# Patient Record
Sex: Female | Born: 1965
Health system: Southern US, Community
[De-identification: ages and names within clinical notes are randomized; demographics above are authoritative.]

## PROBLEM LIST (undated history)

## (undated) DIAGNOSIS — Z803 Family history of malignant neoplasm of breast: Secondary | ICD-10-CM

## (undated) DIAGNOSIS — R32 Unspecified urinary incontinence: Secondary | ICD-10-CM

## (undated) DIAGNOSIS — Z0000 Encounter for general adult medical examination without abnormal findings: Secondary | ICD-10-CM

## (undated) DIAGNOSIS — B029 Zoster without complications: Secondary | ICD-10-CM

## (undated) DIAGNOSIS — Z8041 Family history of malignant neoplasm of ovary: Secondary | ICD-10-CM

## (undated) DIAGNOSIS — N840 Polyp of corpus uteri: Secondary | ICD-10-CM

## (undated) DIAGNOSIS — K640 First degree hemorrhoids: Secondary | ICD-10-CM

## (undated) DIAGNOSIS — G35D Multiple sclerosis, unspecified: Secondary | ICD-10-CM

## (undated) DIAGNOSIS — G35 Multiple sclerosis: Secondary | ICD-10-CM

## (undated) DIAGNOSIS — F32A Depression, unspecified: Secondary | ICD-10-CM

## (undated) DIAGNOSIS — F329 Major depressive disorder, single episode, unspecified: Secondary | ICD-10-CM

## (undated) HISTORY — DX: Zoster without complications: B02.9

## (undated) HISTORY — PX: OVARY SURGERY: SHX727

## (undated) HISTORY — DX: Polyp of corpus uteri: N84.0

## (undated) HISTORY — PX: BREAST CYST ASPIRATION: SHX578

## (undated) HISTORY — PX: POLYPECTOMY: SHX149

## (undated) HISTORY — DX: Encounter for general adult medical examination without abnormal findings: Z00.00

## (undated) HISTORY — DX: Depression, unspecified: F32.A

## (undated) HISTORY — DX: First degree hemorrhoids: K64.0

## (undated) HISTORY — DX: Major depressive disorder, single episode, unspecified: F32.9

## (undated) HISTORY — DX: Family history of malignant neoplasm of breast: Z80.3

## (undated) HISTORY — DX: Unspecified urinary incontinence: R32

## (undated) HISTORY — DX: Family history of malignant neoplasm of ovary: Z80.41

---

## 2005-05-03 ENCOUNTER — Ambulatory Visit: Payer: Self-pay | Admitting: Unknown Physician Specialty

## 2005-12-29 ENCOUNTER — Ambulatory Visit: Payer: Self-pay | Admitting: Neurology

## 2006-10-15 ENCOUNTER — Ambulatory Visit: Payer: Self-pay | Admitting: Emergency Medicine

## 2007-03-19 ENCOUNTER — Ambulatory Visit: Payer: Self-pay | Admitting: Internal Medicine

## 2007-03-27 ENCOUNTER — Ambulatory Visit: Payer: Self-pay | Admitting: Unknown Physician Specialty

## 2007-05-07 HISTORY — PX: OTHER SURGICAL HISTORY: SHX169

## 2007-06-08 ENCOUNTER — Ambulatory Visit: Payer: Self-pay | Admitting: Unknown Physician Specialty

## 2007-06-14 ENCOUNTER — Ambulatory Visit: Payer: Self-pay | Admitting: Unknown Physician Specialty

## 2008-04-23 ENCOUNTER — Ambulatory Visit: Payer: Self-pay | Admitting: Unknown Physician Specialty

## 2009-05-27 ENCOUNTER — Ambulatory Visit: Payer: Self-pay | Admitting: Unknown Physician Specialty

## 2010-05-31 ENCOUNTER — Ambulatory Visit: Payer: Self-pay | Admitting: Unknown Physician Specialty

## 2011-06-11 ENCOUNTER — Ambulatory Visit: Payer: Self-pay | Admitting: Neurology

## 2011-06-22 ENCOUNTER — Ambulatory Visit: Payer: Self-pay | Admitting: Unknown Physician Specialty

## 2012-06-25 ENCOUNTER — Ambulatory Visit: Payer: Self-pay | Admitting: Unknown Physician Specialty

## 2012-06-26 ENCOUNTER — Ambulatory Visit: Payer: Self-pay | Admitting: Unknown Physician Specialty

## 2012-07-19 ENCOUNTER — Other Ambulatory Visit: Payer: Self-pay | Admitting: Neurology

## 2012-07-19 LAB — BASIC METABOLIC PANEL
Anion Gap: 6 — ABNORMAL LOW (ref 7–16)
BUN: 11 mg/dL (ref 7–18)
Chloride: 104 mmol/L (ref 98–107)
Co2: 28 mmol/L (ref 21–32)
EGFR (African American): >60

## 2012-07-19 LAB — CBC WITH DIFFERENTIAL/PLATELET
Basophil %: 0.5 %
Eosinophil %: 1.4 %
Lymphocyte #: 2 10*3/uL (ref 1.0–3.6)
Lymphocyte %: 28 %
MCH: 31.5 pg (ref 26.0–34.0)
MCHC: 34.4 g/dL (ref 32.0–36.0)
Monocyte #: 0.6 x10 3/mm (ref 0.2–0.9)
Monocyte %: 8.2 %
Neutrophil #: 4.5 10*3/uL (ref 1.4–6.5)
Platelet: 274 10*3/uL (ref 150–440)
RBC: 3.9 10*6/uL (ref 3.80–5.20)
WBC: 7.2 10*3/uL (ref 3.6–11.0)

## 2012-07-19 LAB — TSH: Thyroid Stimulating Horm: 2.14 u[IU]/mL

## 2013-05-16 DIAGNOSIS — R32 Unspecified urinary incontinence: Secondary | ICD-10-CM

## 2013-05-16 HISTORY — DX: Unspecified urinary incontinence: R32

## 2013-06-26 ENCOUNTER — Ambulatory Visit: Payer: Self-pay | Admitting: Unknown Physician Specialty

## 2013-06-28 ENCOUNTER — Ambulatory Visit: Payer: Self-pay | Admitting: Neurology

## 2013-10-28 ENCOUNTER — Ambulatory Visit: Payer: Self-pay | Admitting: Obstetrics and Gynecology

## 2013-10-28 LAB — CBC
HCT: 34.5 % — ABNORMAL LOW (ref 35.0–47.0)
HGB: 11.8 g/dL — AB (ref 12.0–16.0)
MCH: 31.2 pg (ref 26.0–34.0)
MCHC: 34 g/dL (ref 32.0–36.0)
MCV: 92 fL (ref 80–100)
Platelet: 243 10*3/uL (ref 150–440)
RBC: 3.76 10*6/uL — ABNORMAL LOW (ref 3.80–5.20)
RDW: 12.2 % (ref 11.5–14.5)
WBC: 5.6 10*3/uL (ref 3.6–11.0)

## 2013-11-07 ENCOUNTER — Ambulatory Visit: Payer: Self-pay | Admitting: Obstetrics and Gynecology

## 2013-11-07 HISTORY — PX: SALPINGECTOMY: SHX328

## 2013-11-08 LAB — PATHOLOGY REPORT

## 2014-01-02 HISTORY — PX: INTRAUTERINE DEVICE (IUD) INSERTION: SHX5877

## 2014-01-06 DIAGNOSIS — R200 Anesthesia of skin: Secondary | ICD-10-CM

## 2014-01-06 DIAGNOSIS — R202 Paresthesia of skin: Secondary | ICD-10-CM

## 2014-01-06 HISTORY — DX: Anesthesia of skin: R20.0

## 2014-01-06 HISTORY — DX: Anesthesia of skin: R20.2

## 2014-06-20 ENCOUNTER — Encounter: Payer: Self-pay | Admitting: Nurse Practitioner

## 2014-06-20 ENCOUNTER — Encounter (INDEPENDENT_AMBULATORY_CARE_PROVIDER_SITE_OTHER): Payer: Self-pay

## 2014-06-20 ENCOUNTER — Ambulatory Visit (INDEPENDENT_AMBULATORY_CARE_PROVIDER_SITE_OTHER): Payer: 59 | Admitting: Nurse Practitioner

## 2014-06-20 VITALS — BP 118/80 | HR 82 | Temp 97.7°F | Resp 12 | Ht 67.0 in | Wt 147.4 lb

## 2014-06-20 DIAGNOSIS — Z0000 Encounter for general adult medical examination without abnormal findings: Secondary | ICD-10-CM

## 2014-06-20 DIAGNOSIS — Z7689 Persons encountering health services in other specified circumstances: Secondary | ICD-10-CM

## 2014-06-20 DIAGNOSIS — Z130 Encounter for screening for diseases of the blood and blood-forming organs and certain disorders involving the immune mechanism: Secondary | ICD-10-CM

## 2014-06-20 DIAGNOSIS — Z7189 Other specified counseling: Secondary | ICD-10-CM

## 2014-06-20 LAB — CBC WITH DIFFERENTIAL/PLATELET
BASOS PCT: 0.3 % (ref 0.0–3.0)
Basophils Absolute: 0 10*3/uL (ref 0.0–0.1)
Eosinophils Absolute: 0.2 10*3/uL (ref 0.0–0.7)
Eosinophils Relative: 2.2 % (ref 0.0–5.0)
HEMATOCRIT: 36.3 % (ref 36.0–46.0)
Hemoglobin: 12.7 g/dL (ref 12.0–15.0)
Lymphocytes Relative: 20.4 % (ref 12.0–46.0)
Lymphs Abs: 1.4 10*3/uL (ref 0.7–4.0)
MCHC: 34.8 g/dL (ref 30.0–36.0)
MCV: 91 fl (ref 78.0–100.0)
MONO ABS: 0.5 10*3/uL (ref 0.1–1.0)
Monocytes Relative: 6.8 % (ref 3.0–12.0)
Neutro Abs: 4.8 10*3/uL (ref 1.4–7.7)
Neutrophils Relative %: 70.3 % (ref 43.0–77.0)
Platelets: 270 10*3/uL (ref 150.0–400.0)
RBC: 3.99 Mil/uL (ref 3.87–5.11)
RDW: 12.5 % (ref 11.5–15.5)
WBC: 6.8 10*3/uL (ref 4.0–10.5)

## 2014-06-20 LAB — TSH: TSH: 1.98 u[IU]/mL (ref 0.35–4.50)

## 2014-06-20 LAB — HEMOGLOBIN A1C: HEMOGLOBIN A1C: 5.6 % (ref 4.6–6.5)

## 2014-06-20 NOTE — Progress Notes (Signed)
Pre visit review using our clinic review tool, if applicable. No additional management support is needed unless otherwise documented below in the visit note. 

## 2014-06-20 NOTE — Patient Instructions (Signed)
Follow up in 1 year or sooner if you need to see [place].   We can send in a referral for Mammogram as needed.   Welcome to [facility]!

## 2014-06-20 NOTE — Progress Notes (Signed)
Subjective:    Patient ID: Pam Hamilton, female    DOB: 06/07/1965, 49 y.o.   MRN: [id number]  HPI  Pam Hamilton is a 49 yo female establishing care.   1) Health Maintenance-   Diet- Cooks at home often, Eating healthier   Exercise- No formal, walks once a week  Immunizations- Up to date- Flu, tetanus   Mammogram- 2015  Pap- 2015  Eye Exam- 2015, up to date  Dental Exam- up to date  2) Chronic Problems-  MS since 1997- Dr. Melrose Nakayama, Neurontin for MS   Optic Neuritis   Cyst on ovary removed- Right 2015  Fibroids in uterus- 2010 at [facility] Dr. Joylene Grapes did the surgery f/u s/p surgery   Shingles- 2015 IV steroids for 3 days for flare up of MS and then broke out with shingles     Review of Systems  Constitutional: Negative for fever, chills, diaphoresis, fatigue and unexpected weight change.  HENT: Negative for tinnitus and trouble swallowing.   Gastrointestinal: Negative for nausea, vomiting, abdominal pain, diarrhea, constipation and blood in stool.  Endocrine: Negative for polydipsia, polyphagia and polyuria.  Genitourinary: Negative for dysuria, hematuria, vaginal bleeding, vaginal discharge and vaginal pain.  Musculoskeletal: Negative for myalgias, back pain, arthralgias and gait problem.  Skin: Negative for color change and rash.  Neurological: Positive for numbness. Negative for dizziness, weakness and headaches.       Numbness of left shin from MS  Hematological: Does not bruise/bleed easily.  Psychiatric/Behavioral: Negative for suicidal ideas and sleep disturbance. The patient is not nervous/anxious.    Past Medical History  Diagnosis Date  . Depression     1997  . Urine incontinence 2015    2 Episodes     History   Social History  . Marital Status: Married    Spouse Name: N/A    Number of Children: N/A  . Years of Education: N/A   Occupational History  . Not on file.   Social History Main Topics  . Smoking status: Never Smoker   .  Smokeless tobacco: Never Used  . Alcohol Use: 0.6 oz/week    1 Glasses of wine per week     Comment: Once a month   . Drug Use: No  . Sexual Activity: Not Currently   Other Topics Concern  . Not on file   Social History Narrative   Lives in [place] with Husband   2 children 18 and 17 both boys   1 Cat and 1 Rabbit   [employer]   Enjoys attending church, being with her boys           Past Surgical History  Procedure Laterality Date  . Breast biopsy  2000    Benign cysts  . Polypectomy  2010    Removal of polyps from uterine.  . Ovary surgery  2015    Right ovary removed.  Benighn cyst.    Family History  Problem Relation Age of Onset  . Hyperlipidemia Mother   . Hypertension Mother   . Diabetes Mother   . Alcohol abuse Father   . Anuerysm Paternal Grandmother     Allergies  Allergen Reactions  . Ciprofloxacin Swelling    Cipro  . Septra [Sulfamethoxazole-Trimethoprim] Swelling    No current outpatient prescriptions on file prior to visit.   No current facility-administered medications on file prior to visit.      Objective:   Physical Exam  Constitutional: She is oriented to  person, place, and time. She appears well-developed and well-nourished. No distress.  BP 118/80 mmHg  Pulse 82  Temp(Src) 97.7 F (36.5 C) (Oral)  Resp 12  Ht 5\' 7"  (1.702 m)  Wt 147 lb 6.4 oz (66.86 kg)  BMI 23.08 kg/m2  SpO2 98%  LMP  (Approximate)   HENT:  Head: Normocephalic and atraumatic.  Right Ear: External ear normal.  Left Ear: External ear normal.  Nose: Nose normal.  Mouth/Throat: Oropharynx is clear and moist. No oropharyngeal exudate.  TMs and canals clear bilaterally  Eyes: Conjunctivae and EOM are normal. Pupils are equal, round, and reactive to light. Right eye exhibits no discharge. Left eye exhibits no discharge. No scleral icterus.  Neck: Normal range of motion. Neck supple. No thyromegaly present.  Cardiovascular: Normal rate,  regular rhythm, normal heart sounds and intact distal pulses.  Exam reveals no gallop and no friction rub.   No murmur heard. Pulmonary/Chest: Effort normal and breath sounds normal. No respiratory distress. She has no wheezes. She has no rales. She exhibits no tenderness.  Abdominal: Soft. Bowel sounds are normal. She exhibits no distension and no mass. There is no tenderness. There is no rebound and no guarding.  Musculoskeletal: Normal range of motion. She exhibits no edema or tenderness.  Lymphadenopathy:    She has no cervical adenopathy.  Neurological: She is alert and oriented to person, place, and time. She has normal reflexes. No cranial nerve deficit. She exhibits normal muscle tone. Coordination normal.  Skin: Skin is warm and dry. No rash noted. She is not diaphoretic. No erythema. No pallor.  Psychiatric: She has a normal mood and affect. Her behavior is normal. Judgment and thought content normal.      Assessment & Plan:

## 2014-06-23 DIAGNOSIS — Z0000 Encounter for general adult medical examination without abnormal findings: Secondary | ICD-10-CM | POA: Insufficient documentation

## 2014-06-23 DIAGNOSIS — Z7689 Persons encountering health services in other specified circumstances: Secondary | ICD-10-CM

## 2014-06-23 HISTORY — DX: Persons encountering health services in other specified circumstances: Z76.89

## 2014-06-23 HISTORY — DX: Encounter for general adult medical examination without abnormal findings: Z00.00

## 2014-06-23 NOTE — Assessment & Plan Note (Signed)
Pt establishing care with [place] today. Other providers include Neuro- Dr. Melrose Nakayama and OB/GYN Dr. Ammie Dalton.

## 2014-06-23 NOTE — Assessment & Plan Note (Signed)
Discussed acute and chronic issues. Reviewed health maintenance measures, PFSHx, and immunizations. Obtain routine labs TSH, Lipid panel, CBC w/ diff, A1c, and CMET. Denies need refills today. FU 1 year for physical.

## 2014-06-27 ENCOUNTER — Other Ambulatory Visit: Payer: 59

## 2014-07-04 ENCOUNTER — Other Ambulatory Visit (INDEPENDENT_AMBULATORY_CARE_PROVIDER_SITE_OTHER): Payer: 59

## 2014-07-04 DIAGNOSIS — Z0000 Encounter for general adult medical examination without abnormal findings: Secondary | ICD-10-CM

## 2014-07-04 LAB — LIPID PANEL
CHOL/HDL RATIO: 2
Cholesterol: 104 mg/dL (ref 0–200)
HDL: 55 mg/dL (ref >39.00)
LDL Cholesterol: 40 mg/dL (ref 0–99)
NonHDL: 49
Triglycerides: 44 mg/dL (ref 0.0–149.0)
VLDL: 8.8 mg/dL (ref 0.0–40.0)

## 2014-07-04 LAB — COMPREHENSIVE METABOLIC PANEL
ALT: 9 U/L (ref 0–35)
AST: 14 U/L (ref 0–37)
Albumin: 4.4 g/dL (ref 3.5–5.2)
Alkaline Phosphatase: 58 U/L (ref 39–117)
BILIRUBIN TOTAL: 0.7 mg/dL (ref 0.2–1.2)
BUN: 14 mg/dL (ref 6–23)
CO2: 28 mEq/L (ref 19–32)
Calcium: 9.3 mg/dL (ref 8.4–10.5)
Chloride: 103 mEq/L (ref 96–112)
Creatinine, Ser: 0.66 mg/dL (ref 0.40–1.20)
GFR: 101.64 mL/min (ref >60.00)
GLUCOSE: 77 mg/dL (ref 70–99)
Potassium: 4.4 mEq/L (ref 3.5–5.1)
Sodium: 137 mEq/L (ref 135–145)
Total Protein: 7.1 g/dL (ref 6.0–8.3)

## 2014-07-22 ENCOUNTER — Ambulatory Visit: Payer: Self-pay | Admitting: Unknown Physician Specialty

## 2014-09-06 NOTE — Op Note (Signed)
PATIENT NAME:  Pam Hamilton, Pam Hamilton MR#:  [id number] DATE OF BIRTH:  04/24/1966  DATE OF PROCEDURE:  11/07/2013  PREOPERATIVE DIAGNOSIS: Chronic pelvic pain, right ovarian cyst and endometrial polyp.   POSTOPERATIVE DIAGNOSIS: Endometriosis with what appears to be a right endometrioma versus hemorrhagic corpus luteum. Normal uterine cavity.   OPERATION PERFORMED: Hysteroscopy, dilatation and curettage,  and right oophorectomy via laparoscopy.   PRIMARY SURGEON: Stoney Bang. Georgianne Fick, MD   ANESTHESIA USED: General.    ESTIMATED BLOOD LOSS: 25 mL.   OPERATIVE FLUIDS: 400 mL of crystalloid.   URINE OUTPUT: 200 mL of clear urine.   PREOPERATIVE ANTIBIOTICS: None.   DRAINS OR TUBES: None.   IMPLANTS: None.   INTRAOPERATIVE FINDINGS: Normal endometrial cavity with focal thickening anterior, more likely disruption of the endometrium from uterine sound. The intra-abdominal anatomy was grossly normal other than some scarring in the posterior cul-de-sac and some subtle old endometriosis-type looking lesions. The right ovary contained a 2-cm ovarian cyst which looked consistent with a possible endometrioma. Both ureters were visualized at the beginning and end of the case. The right ovary was removed intact using a 5-mm LigaSure.   SPECIMENS REMOVED: Endometrial curettings and right ovary.   PATIENT CONDITION FOLLOWING PROCEDURE: Stable.   PROCEDURE IN DETAIL: Risks, benefits, and alternatives of the procedure were discussed with the patient prior to proceeding to the Operating Room. The patient was taken to the Operating Room where she was placed under general endotracheal anesthesia. She was positioned in the dorsal lithotomy position using Allen stirrups, prepped and draped in the usual sterile fashion. A timeout was performed. Attention was turned to the patient's pelvis. The bladder was straight catheterized with a red rubber catheter.   An operative speculum was then placed. The cervix was  grasped with a single-tooth tenaculum and sequentially dilated using Pratt dilators. Hysteroscopy revealed a normal uterine cavity. Sharp curettage was performed. Following this, the single-tooth tenaculum was changed out for a Hulka tenaculum to allow manipulation of the uterus for the laparoscopy portion of the case.   Attention was turned to the patient's abdomen. The umbilicus was infiltrated with 1% lidocaine. A 5-mm Xcel trocar was then used to gain direct entry into the peritoneal cavity under visualization. Pneumoperitoneum was established. A second 5-mm assistant port was placed in [facility] as well as a left lower quadrant 11-mm port. Inspection of the abdomen noted the above findings. The right ovary was removed using a 5 mm LigaSure, detaching the ovary from its attachments to the IP ligament, and utero-ovarian ligament as well as the intervening portion of mesosalpinx. The ureter was clearly visualized coursing well away from the site of dissection. Following removal of the ovary, the ovary was placed in an Endo Catch bag and removed intact. Inspection of the pedicles noted good hemostasis. Pneumoperitoneum was evacuated. The 5-mm port site was closed with 4-0 Monocryl. All port sites were then dressed with Dermabond. Sponge, needle, and instrument counts were correct x2. The patient tolerated the procedure well and was taken to recovery room in stable condition.    ____________________________ Stoney Bang. Georgianne Fick, MD ams:lm D: 11/11/2013 16:12:16 ET T: 11/12/2013 04:34:13 ET JOB#: [id number]  cc: Stoney Bang. Georgianne Fick, MD, <Dictator> Dorthula Nettles MD ELECTRONICALLY SIGNED 11/16/2013 8:08

## 2015-03-10 DIAGNOSIS — G8929 Other chronic pain: Secondary | ICD-10-CM | POA: Insufficient documentation

## 2015-03-10 DIAGNOSIS — M79671 Pain in right foot: Secondary | ICD-10-CM

## 2015-05-14 ENCOUNTER — Encounter: Payer: Self-pay | Admitting: Physician Assistant

## 2015-05-14 ENCOUNTER — Ambulatory Visit: Payer: Self-pay | Admitting: Physician Assistant

## 2015-05-14 VITALS — BP 118/80 | HR 76 | Temp 98.8°F

## 2015-05-14 DIAGNOSIS — J018 Other acute sinusitis: Secondary | ICD-10-CM

## 2015-05-14 MED ORDER — AZITHROMYCIN 250 MG PO TABS: ORAL_TABLET | ORAL | Status: DC

## 2015-05-14 MED ORDER — FLUTICASONE PROPIONATE 50 MCG/ACT NA SUSP: 2.0000 | Freq: Every day | NASAL | Status: DC

## 2015-05-14 NOTE — Progress Notes (Signed)
S: C/o runny nose and congestion for 3 days, no fever, chills, cp/sob, v/d; mucus is green and thick, cough is sporadic, c/o of facial and dental pain.   Using otc meds:   O: PE: perrl eomi, normocephalic, tms dull, nasal mucosa red and swollen, throat injected, neck supple no lymph, lungs c t a, cv rrr, neuro intact  A:  Acute sinusitis   P: zpack, flonase; drink fluids, continue regular meds , use otc meds of choice, return if not improving in 5 days, return earlier if worsening

## 2015-07-14 DIAGNOSIS — Z01419 Encounter for gynecological examination (general) (routine) without abnormal findings: Secondary | ICD-10-CM | POA: Diagnosis not present

## 2015-07-14 LAB — HM PAP SMEAR: HM PAP: NEGATIVE

## 2015-08-14 ENCOUNTER — Other Ambulatory Visit: Payer: Self-pay | Admitting: Unknown Physician Specialty

## 2015-08-14 DIAGNOSIS — Z1231 Encounter for screening mammogram for malignant neoplasm of breast: Secondary | ICD-10-CM

## 2015-08-18 ENCOUNTER — Emergency Department (HOSPITAL_COMMUNITY): Payer: No Typology Code available for payment source

## 2015-08-18 ENCOUNTER — Encounter (HOSPITAL_COMMUNITY): Payer: Self-pay

## 2015-08-18 ENCOUNTER — Emergency Department (HOSPITAL_COMMUNITY)
Admission: EM | Admit: 2015-08-18 | Discharge: 2015-08-18 | Disposition: A | Discharge status: Home / Self Care | Payer: No Typology Code available for payment source | Attending: Emergency Medicine | Admitting: Emergency Medicine

## 2015-08-18 DIAGNOSIS — S0990XA Unspecified injury of head, initial encounter: Secondary | ICD-10-CM | POA: Insufficient documentation

## 2015-08-18 DIAGNOSIS — Z79899 Other long term (current) drug therapy: Secondary | ICD-10-CM | POA: Diagnosis not present

## 2015-08-18 DIAGNOSIS — Y9389 Activity, other specified: Secondary | ICD-10-CM | POA: Insufficient documentation

## 2015-08-18 DIAGNOSIS — Y998 Other external cause status: Secondary | ICD-10-CM | POA: Insufficient documentation

## 2015-08-18 DIAGNOSIS — R102 Pelvic and perineal pain: Secondary | ICD-10-CM | POA: Diagnosis not present

## 2015-08-18 DIAGNOSIS — Z3202 Encounter for pregnancy test, result negative: Secondary | ICD-10-CM | POA: Insufficient documentation

## 2015-08-18 DIAGNOSIS — F329 Major depressive disorder, single episode, unspecified: Secondary | ICD-10-CM | POA: Diagnosis not present

## 2015-08-18 DIAGNOSIS — S29001A Unspecified injury of muscle and tendon of front wall of thorax, initial encounter: Secondary | ICD-10-CM | POA: Diagnosis present

## 2015-08-18 DIAGNOSIS — Y92410 Unspecified street and highway as the place of occurrence of the external cause: Secondary | ICD-10-CM | POA: Insufficient documentation

## 2015-08-18 DIAGNOSIS — S3991XA Unspecified injury of abdomen, initial encounter: Secondary | ICD-10-CM | POA: Diagnosis not present

## 2015-08-18 DIAGNOSIS — S199XXA Unspecified injury of neck, initial encounter: Secondary | ICD-10-CM | POA: Diagnosis not present

## 2015-08-18 DIAGNOSIS — S299XXA Unspecified injury of thorax, initial encounter: Secondary | ICD-10-CM | POA: Insufficient documentation

## 2015-08-18 DIAGNOSIS — M546 Pain in thoracic spine: Secondary | ICD-10-CM | POA: Diagnosis not present

## 2015-08-18 DIAGNOSIS — S2232XA Fracture of one rib, left side, initial encounter for closed fracture: Secondary | ICD-10-CM | POA: Insufficient documentation

## 2015-08-18 DIAGNOSIS — S3992XA Unspecified injury of lower back, initial encounter: Secondary | ICD-10-CM | POA: Diagnosis not present

## 2015-08-18 DIAGNOSIS — R51 Headache: Secondary | ICD-10-CM | POA: Diagnosis not present

## 2015-08-18 DIAGNOSIS — M25512 Pain in left shoulder: Secondary | ICD-10-CM | POA: Diagnosis not present

## 2015-08-18 DIAGNOSIS — S2242XA Multiple fractures of ribs, left side, initial encounter for closed fracture: Secondary | ICD-10-CM | POA: Diagnosis not present

## 2015-08-18 DIAGNOSIS — M545 Low back pain: Secondary | ICD-10-CM | POA: Diagnosis not present

## 2015-08-18 HISTORY — DX: Multiple sclerosis, unspecified: G35.D

## 2015-08-18 HISTORY — DX: Multiple sclerosis: G35

## 2015-08-18 LAB — I-STAT CHEM 8, ED
BUN: 16 mg/dL (ref 6–20)
Calcium, Ion: 1.12 mmol/L (ref 1.12–1.23)
Chloride: 102 mmol/L (ref 101–111)
Creatinine, Ser: 0.8 mg/dL (ref 0.44–1.00)
Glucose, Bld: 126 mg/dL — ABNORMAL HIGH (ref 65–99)
HEMATOCRIT: 39 % (ref 36.0–46.0)
HEMOGLOBIN: 13.3 g/dL (ref 12.0–15.0)
Potassium: 3.9 mmol/L (ref 3.5–5.1)
Sodium: 139 mmol/L (ref 135–145)
TCO2: 26 mmol/L (ref 0–100)

## 2015-08-18 LAB — I-STAT BETA HCG BLOOD, ED (MC, WL, AP ONLY): I-stat hCG, quantitative: <5 m[IU]/mL (ref <5)

## 2015-08-18 MED ORDER — MORPHINE SULFATE (PF) 2 MG/ML IV SOLN
2.0000 mg | Freq: Once | INTRAVENOUS | Status: AC
  Administered 2015-08-18: 2 mg via INTRAVENOUS
  Filled 2015-08-18: qty 1

## 2015-08-18 MED ORDER — NAPROXEN 250 MG PO TABS
500.0000 mg | ORAL_TABLET | Freq: Once | ORAL | Status: AC
  Administered 2015-08-18: 500 mg via ORAL
  Filled 2015-08-18: qty 2

## 2015-08-18 MED ORDER — MORPHINE SULFATE (PF) 4 MG/ML IV SOLN: 4.0000 mg | Freq: Once | INTRAVENOUS | Status: DC

## 2015-08-18 MED ORDER — IOPAMIDOL (ISOVUE-300) INJECTION 61%
INTRAVENOUS | Status: AC
  Administered 2015-08-18: 80 mL via INTRAVENOUS
  Filled 2015-08-18: qty 100

## 2015-08-18 MED ORDER — OXYCODONE-ACETAMINOPHEN 5-325 MG PO TABS
1.0000 | ORAL_TABLET | Freq: Once | ORAL | Status: AC
  Administered 2015-08-18: 1 via ORAL
  Filled 2015-08-18: qty 1

## 2015-08-18 MED ORDER — NAPROXEN 500 MG PO TABS: 500.0000 mg | ORAL_TABLET | Freq: Two times a day (BID) | ORAL | Status: DC | PRN

## 2015-08-18 MED ORDER — OXYCODONE-ACETAMINOPHEN 5-325 MG PO TABS: 2.0000 | ORAL_TABLET | Freq: Four times a day (QID) | ORAL | Status: DC | PRN

## 2015-08-18 NOTE — ED Notes (Signed)
Pt ambulated in hallway without assistance from staff, denies SOB.
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
## 2015-08-18 NOTE — ED Notes (Signed)
Patient transported to CT 

## 2015-08-20 ENCOUNTER — Telehealth: Payer: Self-pay | Admitting: *Deleted

## 2015-08-20 NOTE — Telephone Encounter (Signed)
Spoke with patient and because [facility] does not have any appointments that patient could do put her on Dr. Geralynn Ochs schedule.
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
## 2015-08-21 ENCOUNTER — Ambulatory Visit (INDEPENDENT_AMBULATORY_CARE_PROVIDER_SITE_OTHER): Payer: 59 | Admitting: Family Medicine

## 2015-08-21 ENCOUNTER — Encounter: Payer: Self-pay | Admitting: Family Medicine

## 2015-08-21 VITALS — BP 128/78 | HR 75 | Temp 97.8°F | Wt 144.4 lb

## 2015-08-21 DIAGNOSIS — S2242XA Multiple fractures of ribs, left side, initial encounter for closed fracture: Secondary | ICD-10-CM | POA: Diagnosis not present

## 2015-08-21 MED ORDER — OXYCODONE-ACETAMINOPHEN 5-325 MG PO TABS: 1.0000 | ORAL_TABLET | Freq: Four times a day (QID) | ORAL | Status: DC | PRN
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
## 2015-08-21 NOTE — Progress Notes (Signed)
Pre visit review using our clinic review tool, if applicable. No additional management support is needed unless otherwise documented below in the visit note. 

## 2015-08-24 ENCOUNTER — Encounter: Payer: Self-pay | Admitting: Family Medicine

## 2015-08-24 DIAGNOSIS — S2249XA Multiple fractures of ribs, unspecified side, initial encounter for closed fracture: Secondary | ICD-10-CM | POA: Insufficient documentation
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
## 2015-08-25 ENCOUNTER — Ambulatory Visit: Payer: Self-pay | Admitting: Family Medicine

## 2015-08-25 ENCOUNTER — Ambulatory Visit: Payer: 59

## 2015-08-31 ENCOUNTER — Ambulatory Visit (INDEPENDENT_AMBULATORY_CARE_PROVIDER_SITE_OTHER): Payer: 59 | Admitting: Nurse Practitioner

## 2015-08-31 ENCOUNTER — Encounter: Payer: Self-pay | Admitting: Nurse Practitioner

## 2015-08-31 VITALS — BP 111/66 | HR 71 | Temp 98.5°F | Ht 67.0 in | Wt 140.5 lb

## 2015-08-31 DIAGNOSIS — S2242XD Multiple fractures of ribs, left side, subsequent encounter for fracture with routine healing: Secondary | ICD-10-CM | POA: Diagnosis not present

## 2015-08-31 DIAGNOSIS — S2242XA Multiple fractures of ribs, left side, initial encounter for closed fracture: Secondary | ICD-10-CM

## 2015-08-31 MED ORDER — OXYCODONE-ACETAMINOPHEN 5-325 MG PO TABS: 1.0000 | ORAL_TABLET | Freq: Three times a day (TID) | ORAL | Status: DC | PRN
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
## 2015-08-31 NOTE — Progress Notes (Signed)
Pre visit review using our clinic review tool, if applicable. No additional management support is needed unless otherwise documented below in the visit note. 

## 2015-08-31 NOTE — Patient Instructions (Signed)
See you around 09/15/15   Try the 1/2 tablet in the morning and 1/2 tablet in the evening.

## 2015-09-06 NOTE — Assessment & Plan Note (Signed)
Established problem worsening Same symptoms as last visit with Dr. Caryl Bis Encouraged continued use of incentive spirometer Letter sent for patient to remain out of work and follow-up in 2 weeks.  Refill of pain medication given today and asked her to use half as needed for pain control. Patient is agreeable upon this and reports she has done this with success in the past.

## 2015-09-14 ENCOUNTER — Ambulatory Visit: Payer: Self-pay | Admitting: Nurse Practitioner

## 2015-09-14 ENCOUNTER — Ambulatory Visit (INDEPENDENT_AMBULATORY_CARE_PROVIDER_SITE_OTHER): Payer: 59 | Admitting: Nurse Practitioner

## 2015-09-14 ENCOUNTER — Encounter: Payer: Self-pay | Admitting: Nurse Practitioner

## 2015-09-14 VITALS — BP 112/68 | HR 71 | Temp 98.1°F | Ht 67.0 in | Wt 139.8 lb

## 2015-09-14 DIAGNOSIS — S2242XD Multiple fractures of ribs, left side, subsequent encounter for fracture with routine healing: Secondary | ICD-10-CM

## 2015-09-14 DIAGNOSIS — S2242XA Multiple fractures of ribs, left side, initial encounter for closed fracture: Secondary | ICD-10-CM

## 2015-09-14 NOTE — Assessment & Plan Note (Addendum)
Weaning slowly off of percocets. Improving slowly  Precautions for 3-4 weeks at work (lifting restrictions) note given to pt FU prn worsening/failure to improve.

## 2015-09-14 NOTE — Progress Notes (Signed)
Patient ID: Pam Hamilton, female    DOB: 1966/05/07  Age: 50 y.o. MRN: [id number]  CC: Follow-up   HPI Pam Hamilton presents for follow up of rib fractures.   1) Pt was last seen 2 weeks ago for follow up. Still using incentive spirometer, letter to be out of work for 2 weeks Pain medication given to pt   Today: Pain improving Taking 1/2 tablet am and 1/2 pm  1/2 is still helpful, tried without medication today and then couldn't stand the pain.  Lying slightly more onto left side without too much pain  Sternal pain improved  Anxiety is improved with driving Walking more often- wants to get back to running   History Opel has a past medical history of Depression; Urine incontinence (2015); and Multiple sclerosis ([place]).   She has past surgical history that includes Breast biopsy (2000); Polypectomy (2010); and Ovary surgery (2015).   Her family history includes Alcohol abuse in her father; Anuerysm in her paternal grandmother; Diabetes in her mother; Hyperlipidemia in her mother; Hypertension in her mother.She reports that she has never smoked. She has never used smokeless tobacco. She reports that she drinks about 0.6 oz of alcohol per week. She reports that she does not use illicit drugs.  Outpatient Prescriptions Prior to Visit  Medication Sig Dispense Refill  . cholecalciferol (VITAMIN D) 1000 units tablet Take 1,000 Units by mouth daily.    . Dimethyl Fumarate 120 & 240 MG MISC Take 240 mg by mouth 2 (two) times daily.    Marland Kitchen gabapentin (NEURONTIN) 100 MG capsule Take 100 mg by mouth daily as needed. For foot pain    . oxyCODONE-acetaminophen (PERCOCET/ROXICET) 5-325 MG tablet Take 1 tablet by mouth every 8 (eight) hours as needed for severe pain. 30 tablet 0   No facility-administered medications prior to visit.    ROS Review of Systems  Constitutional: Negative for fever, chills, diaphoresis and fatigue.  Respiratory: Negative for chest tightness, shortness of  breath and wheezing.   Cardiovascular: Negative for chest pain, palpitations and leg swelling.  Gastrointestinal: Negative for nausea, vomiting and diarrhea.  Skin: Negative for rash.  Neurological: Negative for dizziness, weakness, numbness and headaches.  Psychiatric/Behavioral: The patient is not nervous/anxious.     Objective:  BP 112/68 mmHg  Pulse 71  Temp(Src) 98.1 F (36.7 C) (Oral)  Ht 5\' 7"  (1.702 m)  Wt 139 lb 12.8 oz (63.413 kg)  BMI 21.89 kg/m2  SpO2 99%  LMP 09/05/2015 (Exact Date)  Physical Exam  Constitutional: She is oriented to person, place, and time. She appears well-developed and well-nourished. No distress.  HENT:  Head: Normocephalic and atraumatic.  Right Ear: External ear normal.  Left Ear: External ear normal.  Cardiovascular: Normal rate, regular rhythm and normal heart sounds.   Pulmonary/Chest: Effort normal and breath sounds normal. No respiratory distress. She has no wheezes. She has no rales. She exhibits no tenderness.  Neurological: She is alert and oriented to person, place, and time. No cranial nerve deficit. She exhibits normal muscle tone. Coordination normal.  Skin: Skin is warm and dry. No rash noted. She is not diaphoretic.  Psychiatric: She has a normal mood and affect. Her behavior is normal. Judgment and thought content normal.   Assessment & Plan:   Pam Hamilton was seen today for follow-up.  Diagnoses and all orders for this visit:  Fracture of multiple ribs, left, closed, initial encounter  I am having Ms. Yodice maintain her gabapentin, Dimethyl Fumarate, cholecalciferol,  and oxyCODONE-acetaminophen.  No orders of the defined types were placed in this encounter.     Follow-up: Return if symptoms worsen or fail to improve.

## 2015-09-14 NOTE — Patient Instructions (Addendum)
Slowly start with the weaning of 1/2 tablets.   Keep up the good work!

## 2015-09-29 DIAGNOSIS — R202 Paresthesia of skin: Secondary | ICD-10-CM | POA: Diagnosis not present

## 2015-09-29 DIAGNOSIS — G35 Multiple sclerosis: Secondary | ICD-10-CM | POA: Diagnosis not present

## 2015-09-29 DIAGNOSIS — R200 Anesthesia of skin: Secondary | ICD-10-CM | POA: Diagnosis not present

## 2015-10-01 ENCOUNTER — Ambulatory Visit
Admission: RE | Admit: 2015-10-01 | Discharge: 2015-10-01 | Disposition: A | Discharge status: Home / Self Care | Payer: 59 | Source: Ambulatory Visit | Attending: Unknown Physician Specialty | Admitting: Unknown Physician Specialty

## 2015-10-01 ENCOUNTER — Other Ambulatory Visit: Payer: Self-pay | Admitting: Unknown Physician Specialty

## 2015-10-01 DIAGNOSIS — Z1231 Encounter for screening mammogram for malignant neoplasm of breast: Secondary | ICD-10-CM

## 2015-11-04 ENCOUNTER — Other Ambulatory Visit: Payer: Self-pay | Admitting: Neurology

## 2015-11-04 DIAGNOSIS — G35 Multiple sclerosis: Secondary | ICD-10-CM

## 2015-11-23 DIAGNOSIS — H469 Unspecified optic neuritis: Secondary | ICD-10-CM | POA: Diagnosis not present

## 2015-11-24 ENCOUNTER — Ambulatory Visit
Admission: RE | Admit: 2015-11-24 | Discharge: 2015-11-24 | Disposition: A | Discharge status: Home / Self Care | Payer: 59 | Source: Ambulatory Visit | Attending: Neurology | Admitting: Neurology

## 2015-11-24 DIAGNOSIS — G35 Multiple sclerosis: Secondary | ICD-10-CM | POA: Diagnosis not present

## 2015-11-24 DIAGNOSIS — R9082 White matter disease, unspecified: Secondary | ICD-10-CM | POA: Diagnosis not present

## 2015-11-24 MED ORDER — GADOBENATE DIMEGLUMINE 529 MG/ML IV SOLN
15.0000 mL | Freq: Once | INTRAVENOUS | Status: AC | PRN
  Administered 2015-11-24: 13 mL via INTRAVENOUS

## 2016-02-25 ENCOUNTER — Encounter: Payer: Self-pay | Admitting: Physician Assistant

## 2016-02-25 ENCOUNTER — Ambulatory Visit: Payer: Self-pay | Admitting: Physician Assistant

## 2016-02-25 VITALS — BP 110/82 | HR 84 | Temp 98.6°F

## 2016-02-25 DIAGNOSIS — J069 Acute upper respiratory infection, unspecified: Secondary | ICD-10-CM

## 2016-02-25 MED ORDER — AZITHROMYCIN 250 MG PO TABS: ORAL_TABLET | ORAL | 0 refills | Status: DC

## 2016-02-25 MED ORDER — BENZONATATE 200 MG PO CAPS: 200.0000 mg | ORAL_CAPSULE | Freq: Three times a day (TID) | ORAL | 0 refills | Status: DC | PRN
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
## 2016-07-21 ENCOUNTER — Ambulatory Visit: Payer: Self-pay | Admitting: Physician Assistant

## 2016-07-21 ENCOUNTER — Encounter: Payer: Self-pay | Admitting: Physician Assistant

## 2016-07-21 VITALS — BP 124/80 | HR 72 | Temp 98.5°F

## 2016-07-21 DIAGNOSIS — J301 Allergic rhinitis due to pollen: Secondary | ICD-10-CM

## 2016-07-21 MED ORDER — FLUTICASONE PROPIONATE 50 MCG/ACT NA SUSP: 2.0000 | Freq: Every day | NASAL | 6 refills | Status: DC

## 2016-07-21 NOTE — Progress Notes (Signed)
S: C/o runny nose and congestion for 2 days, no fever, chills, cp/sob, v/d; mucus is clear and thick, cough is sporadic, c/o of facial and dental pain.   Using otc meds:   O: PE: vitals wnl, nad, perrl eomi, normocephalic, tms dull, nasal mucosa red and swollen, throat injected, neck supple no lymph, lungs c t a, cv rrr, neuro intact  A:  Acute sinusitis   P: drink fluids, continue regular meds , use otc meds of choice, return if not improving in 5 days, return earlier if worsening , flonase, if not improving will call in antibiotic

## 2016-07-28 ENCOUNTER — Telehealth: Payer: Self-pay | Admitting: Physician Assistant

## 2016-07-28 MED ORDER — CEFDINIR 300 MG PO CAPS: 300.0000 mg | ORAL_CAPSULE | Freq: Two times a day (BID) | ORAL | 0 refills | Status: DC

## 2016-07-28 NOTE — Telephone Encounter (Signed)
Pt not feeling better, called in omnicef to [place]

## 2016-08-10 ENCOUNTER — Telehealth: Payer: Self-pay | Admitting: Obstetrics and Gynecology

## 2016-08-10 NOTE — Telephone Encounter (Signed)
Pt is calling to find out if she can be seen sonner then her schedule appt 09/06/16. Pt advised at this time no opening at this tme. Pt is reporting she is having several hot flashes and night sweats. Pt needs advice on what she needs to do until then. CB# [phone]

## 2016-08-10 NOTE — Telephone Encounter (Signed)
Please advise of otc medications she can take for this. AMS is out of the office until 4/2

## 2016-08-10 NOTE — Telephone Encounter (Signed)
There's not a lot of OTC meds for these things. Just dress appropriately. There's herbs like black cohosh and other supplements. How old is this person? If she's pregnant she can't take those herbs. If she's menopausal she can take any of the menopausal otc things out there. Unless she has some crazy medical history?

## 2016-08-10 NOTE — Telephone Encounter (Signed)
Pt aware of JEG suggestions. She would like to be put on a cancellation list for an earlier appointment. Pam Hamilton is there a list? If so can she be put on the call list. KJ CMA

## 2016-08-15 DIAGNOSIS — E538 Deficiency of other specified B group vitamins: Secondary | ICD-10-CM | POA: Diagnosis not present

## 2016-08-15 DIAGNOSIS — E559 Vitamin D deficiency, unspecified: Secondary | ICD-10-CM | POA: Diagnosis not present

## 2016-08-15 DIAGNOSIS — R202 Paresthesia of skin: Secondary | ICD-10-CM | POA: Diagnosis not present

## 2016-08-15 DIAGNOSIS — G35 Multiple sclerosis: Secondary | ICD-10-CM | POA: Diagnosis not present

## 2016-08-15 DIAGNOSIS — R200 Anesthesia of skin: Secondary | ICD-10-CM | POA: Diagnosis not present

## 2016-08-17 DIAGNOSIS — E538 Deficiency of other specified B group vitamins: Secondary | ICD-10-CM | POA: Insufficient documentation

## 2016-08-26 NOTE — Telephone Encounter (Signed)
Pt on wait list

## 2016-09-06 ENCOUNTER — Ambulatory Visit (INDEPENDENT_AMBULATORY_CARE_PROVIDER_SITE_OTHER): Payer: 59 | Admitting: Obstetrics and Gynecology

## 2016-09-06 ENCOUNTER — Encounter: Payer: Self-pay | Admitting: Obstetrics and Gynecology

## 2016-09-06 VITALS — BP 124/70 | HR 76 | Ht 66.0 in | Wt 142.0 lb

## 2016-09-06 DIAGNOSIS — Z1211 Encounter for screening for malignant neoplasm of colon: Secondary | ICD-10-CM

## 2016-09-06 DIAGNOSIS — Z01419 Encounter for gynecological examination (general) (routine) without abnormal findings: Secondary | ICD-10-CM | POA: Diagnosis not present

## 2016-09-06 DIAGNOSIS — Z1231 Encounter for screening mammogram for malignant neoplasm of breast: Secondary | ICD-10-CM

## 2016-09-06 DIAGNOSIS — Z1239 Encounter for other screening for malignant neoplasm of breast: Secondary | ICD-10-CM

## 2016-09-06 NOTE — Patient Instructions (Signed)
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
## 2016-09-09 DIAGNOSIS — Z760 Encounter for issue of repeat prescription: Secondary | ICD-10-CM | POA: Diagnosis not present

## 2016-09-26 ENCOUNTER — Other Ambulatory Visit: Payer: Self-pay

## 2016-09-26 ENCOUNTER — Telehealth: Payer: Self-pay

## 2016-09-26 DIAGNOSIS — K219 Gastro-esophageal reflux disease without esophagitis: Secondary | ICD-10-CM | POA: Insufficient documentation

## 2016-09-26 DIAGNOSIS — Z1211 Encounter for screening for malignant neoplasm of colon: Secondary | ICD-10-CM

## 2016-09-26 MED ORDER — NA SULFATE-K SULFATE-MG SULF 17.5-3.13-1.6 GM/177ML PO SOLN: 1.0000 | Freq: Once | ORAL | 0 refills | Status: AC

## 2016-09-26 NOTE — Telephone Encounter (Signed)
Gastroenterology Pre-Procedure Review  Request Date: 7/6 Requesting Physician: Dr. Vicente Males  PATIENT REVIEW QUESTIONS: The patient responded to the following health history questions as indicated:    1. Are you having any GI issues? no 2. Do you have a personal history of Polyps? no 3. Do you have a family history of Colon Cancer or Polyps? no 4. Diabetes Mellitus? no 5. Joint replacements in the past 12 months?no 6. Major health problems in the past 3 months?no 7. Any artificial heart valves, MVP, or defibrillator?no    MEDICATIONS & ALLERGIES:    Patient reports the following regarding taking any anticoagulation/antiplatelet therapy:   Plavix, Coumadin, Eliquis, Xarelto, Lovenox, Pradaxa, Brilinta, or Effient? no Aspirin? no  Patient confirms/reports the following medications:  Current Outpatient Prescriptions  Medication Sig Dispense Refill  . Black Cohosh 80 MG CAPS Take by mouth.    . cholecalciferol (VITAMIN D) 1000 units tablet Take 1,000 Units by mouth daily.    Marland Kitchen levonorgestrel (MIRENA) 20 MCG/24HR IUD 1 each by Intrauterine route once.    . TECFIDERA 240 MG CPDR      No current facility-administered medications for this visit.     Patient confirms/reports the following allergies:  Allergies  Allergen Reactions  . Ciprofloxacin Swelling    Cipro  . Septra [Sulfamethoxazole-Trimethoprim] Swelling    No orders of the defined types were placed in this encounter.   AUTHORIZATION INFORMATION Primary Insurance: 1D#: Group #:  Secondary Insurance: 1D#: Group #:  SCHEDULE INFORMATION: Date: 7/6 Time: Location: [facility]

## 2016-09-27 ENCOUNTER — Telehealth: Payer: Self-pay | Admitting: Gastroenterology

## 2016-09-27 NOTE — Telephone Encounter (Signed)
09/27/16 UMR website for Screening Colonoscopy [phone] / Z12.11 no ref # given.

## 2016-09-27 NOTE — Telephone Encounter (Signed)
09/27/16 Per Amy at [facility] no prior auth required for procedure.

## 2016-10-03 ENCOUNTER — Ambulatory Visit
Admission: RE | Admit: 2016-10-03 | Discharge: 2016-10-03 | Disposition: A | Discharge status: Home / Self Care | Payer: 59 | Source: Ambulatory Visit | Attending: Obstetrics and Gynecology | Admitting: Obstetrics and Gynecology

## 2016-10-03 DIAGNOSIS — Z01419 Encounter for gynecological examination (general) (routine) without abnormal findings: Secondary | ICD-10-CM | POA: Diagnosis not present

## 2016-10-03 DIAGNOSIS — Z1231 Encounter for screening mammogram for malignant neoplasm of breast: Secondary | ICD-10-CM | POA: Diagnosis not present

## 2016-10-03 DIAGNOSIS — Z1239 Encounter for other screening for malignant neoplasm of breast: Secondary | ICD-10-CM

## 2016-10-04 ENCOUNTER — Encounter: Payer: Self-pay | Admitting: Obstetrics and Gynecology

## 2016-11-15 ENCOUNTER — Other Ambulatory Visit: Payer: Self-pay

## 2016-11-15 DIAGNOSIS — Z1212 Encounter for screening for malignant neoplasm of rectum: Principal | ICD-10-CM

## 2016-11-15 DIAGNOSIS — Z1211 Encounter for screening for malignant neoplasm of colon: Secondary | ICD-10-CM

## 2016-11-15 MED ORDER — NA SULFATE-K SULFATE-MG SULF 17.5-3.13-1.6 GM/177ML PO SOLN: 1.0000 | Freq: Once | ORAL | 0 refills | Status: AC

## 2016-11-18 ENCOUNTER — Ambulatory Visit: Payer: 59 | Admitting: Anesthesiology

## 2016-11-18 ENCOUNTER — Encounter: Payer: Self-pay | Admitting: Anesthesiology

## 2016-11-18 ENCOUNTER — Encounter
Admission: RE | Disposition: A | Discharge status: Home / Self Care | Payer: Self-pay | Source: Ambulatory Visit | Attending: Gastroenterology

## 2016-11-18 ENCOUNTER — Ambulatory Visit
Admission: RE | Admit: 2016-11-18 | Discharge: 2016-11-18 | Disposition: A | Discharge status: Home / Self Care | Payer: 59 | Source: Ambulatory Visit | Attending: Gastroenterology | Admitting: Gastroenterology

## 2016-11-18 DIAGNOSIS — K219 Gastro-esophageal reflux disease without esophagitis: Secondary | ICD-10-CM | POA: Diagnosis not present

## 2016-11-18 DIAGNOSIS — K640 First degree hemorrhoids: Secondary | ICD-10-CM | POA: Diagnosis not present

## 2016-11-18 DIAGNOSIS — Z1211 Encounter for screening for malignant neoplasm of colon: Secondary | ICD-10-CM | POA: Diagnosis not present

## 2016-11-18 DIAGNOSIS — Z79899 Other long term (current) drug therapy: Secondary | ICD-10-CM | POA: Insufficient documentation

## 2016-11-18 DIAGNOSIS — G35 Multiple sclerosis: Secondary | ICD-10-CM | POA: Insufficient documentation

## 2016-11-18 DIAGNOSIS — F329 Major depressive disorder, single episode, unspecified: Secondary | ICD-10-CM | POA: Diagnosis not present

## 2016-11-18 DIAGNOSIS — K5790 Diverticulosis of intestine, part unspecified, without perforation or abscess without bleeding: Secondary | ICD-10-CM | POA: Diagnosis not present

## 2016-11-18 DIAGNOSIS — Z0000 Encounter for general adult medical examination without abnormal findings: Secondary | ICD-10-CM

## 2016-11-18 DIAGNOSIS — K5730 Diverticulosis of large intestine without perforation or abscess without bleeding: Secondary | ICD-10-CM | POA: Diagnosis not present

## 2016-11-18 HISTORY — PX: COLONOSCOPY WITH PROPOFOL: SHX5780

## 2016-11-18 LAB — POCT PREGNANCY, URINE: PREG TEST UR: NEGATIVE

## 2016-11-18 SURGERY — COLONOSCOPY WITH PROPOFOL
Anesthesia: General

## 2016-11-18 MED ORDER — SODIUM CHLORIDE 0.9 % IV SOLN
INTRAVENOUS | Status: DC
  Administered 2016-11-18: 08:00:00 via INTRAVENOUS

## 2016-11-18 MED ORDER — PROPOFOL 500 MG/50ML IV EMUL
INTRAVENOUS | Status: DC | PRN
  Administered 2016-11-18: 140 ug/kg/min via INTRAVENOUS

## 2016-11-18 MED ORDER — PROPOFOL 10 MG/ML IV BOLUS
INTRAVENOUS | Status: DC | PRN
  Administered 2016-11-18: 10 mg via INTRAVENOUS
  Administered 2016-11-18: 70 mg via INTRAVENOUS
  Administered 2016-11-18 (×2): 20 mg via INTRAVENOUS

## 2016-11-18 MED ORDER — PROPOFOL 500 MG/50ML IV EMUL
INTRAVENOUS | Status: AC
  Filled 2016-11-18: qty 50

## 2016-11-18 MED ORDER — MIDAZOLAM HCL 2 MG/2ML IJ SOLN
INTRAMUSCULAR | Status: DC | PRN
  Administered 2016-11-18 (×2): 1 mg via INTRAVENOUS

## 2016-11-18 MED ORDER — LIDOCAINE HCL (CARDIAC) 20 MG/ML IV SOLN
INTRAVENOUS | Status: DC | PRN
  Administered 2016-11-18: 40 mg via INTRAVENOUS

## 2016-11-18 MED ORDER — LIDOCAINE HCL (PF) 2 % IJ SOLN
INTRAMUSCULAR | Status: AC
  Filled 2016-11-18: qty 2

## 2016-11-18 MED ORDER — MIDAZOLAM HCL 2 MG/2ML IJ SOLN
INTRAMUSCULAR | Status: AC
  Filled 2016-11-18: qty 2

## 2016-11-18 NOTE — Anesthesia Preprocedure Evaluation (Signed)
Anesthesia Evaluation  Patient identified by MRN, date of birth, ID band Patient awake    Reviewed: Allergy & Precautions, NPO status , Patient's Chart, lab work & pertinent test results, reviewed documented beta blocker date and time   Airway Mallampati: II  TM Distance: >3 FB     Dental  (+) Chipped   Pulmonary           Cardiovascular      Neuro/Psych PSYCHIATRIC DISORDERS Depression    GI/Hepatic GERD  ,  Endo/Other    Renal/GU      Musculoskeletal   Abdominal   Peds  Hematology   Anesthesia Other Findings MS.  Reproductive/Obstetrics                             Anesthesia Physical Anesthesia Plan  ASA: III  Anesthesia Plan: General   Post-op Pain Management:    Induction: Intravenous  PONV Risk Score and Plan:   Airway Management Planned:   Additional Equipment:   Intra-op Plan:   Post-operative Plan:   Informed Consent: I have reviewed the patients History and Physical, chart, labs and discussed the procedure including the risks, benefits and alternatives for the proposed anesthesia with the patient or authorized representative who has indicated his/her understanding and acceptance.     Plan Discussed with: CRNA  Anesthesia Plan Comments:         Anesthesia Quick Evaluation

## 2016-11-18 NOTE — Anesthesia Procedure Notes (Signed)
Date/Time: 11/18/2016 8:45 AM Performed by: Hedda Slade Pre-anesthesia Checklist: Patient identified, Emergency Drugs available, Suction available and Patient being monitored Patient Re-evaluated:Patient Re-evaluated prior to inductionOxygen Delivery Method: Nasal cannula

## 2016-11-18 NOTE — H&P (Signed)
Jonathon Bellows MD [address], [place], [place] [address] Phone: [phone] Fax : [phone]  Primary Care Physician:  Patient, No Pcp Per Primary Gastroenterologist:  Dr. Jonathon Bellows   Pre-Procedure History & Physical: HPI:  Pam Hamilton is a 51 y.o. female is here for an colonoscopy.   Past Medical History:  Diagnosis Date  . Depression    1997  . Multiple sclerosis ([place])   . Urine incontinence 2015   2 Episodes     Past Surgical History:  Procedure Laterality Date  . BREAST CYST ASPIRATION     neg  . OVARY SURGERY  2015   Right ovary removed.  [place] cyst.  . POLYPECTOMY  2010   Removal of polyps from uterine.    Prior to Admission medications   Medication Sig Start Date End Date Taking? Authorizing Provider  cholecalciferol (VITAMIN D) 1000 units tablet Take 1,000 Units by mouth daily.   Yes [provider]  fluticasone Asencion Islam) 50 MCG/ACT nasal spray  07/21/16  Yes [provider]  [place]  06/23/16  Yes [provider]  Black Cohosh 80 MG CAPS Take by mouth.    [provider]  cefdinir (OMNICEF) 300 MG capsule  07/28/16   [provider]  gabapentin (NEURONTIN) 100 MG capsule  09/06/16   [provider]  levonorgestrel (MIRENA) 20 MCG/24HR IUD 1 each by Intrauterine route once.    [provider]    Allergies as of 09/26/2016 - Review Complete 09/06/2016  Allergen Reaction Noted  . Ciprofloxacin Swelling 06/20/2014  . Septra [sulfamethoxazole-trimethoprim] Swelling 06/20/2014    Family History  Problem Relation Age of Onset  . Hyperlipidemia Mother   . Hypertension Mother   . Diabetes Mother   . Alcohol abuse Father   . Anuerysm Paternal Grandmother   . Breast cancer Maternal Aunt   . Breast cancer Cousin     Social History   Social History  . Marital status: Married    Spouse name: N/A  . Number of children: N/A  . Years of education: N/A   Occupational  History  . Not on file.   Social History Main Topics  . Smoking status: Never Smoker  . Smokeless tobacco: Never Used  . Alcohol use 0.6 oz/week    1 Glasses of wine per week     Comment: Once a month   . Drug use: No  . Sexual activity: Not Currently   Other Topics Concern  . Not on file   Social History Narrative   Lives in [place] with Husband   2 children 73 and 17 both boys   1 Cat and 1 Rabbit   [employer]   Enjoys attending church, being with her boys           Review of Systems: See HPI, otherwise negative ROS  Physical Exam: BP 119/76   Pulse 93   Temp (!) 96.7 F (35.9 C) (Tympanic)   Resp 18   Ht 5\' 6"  (1.676 m)   Wt 140 lb (63.5 kg)   SpO2 100%   BMI 22.60 kg/m  General:   Alert,  pleasant and cooperative in NAD Head:  Normocephalic and atraumatic. Neck:  Supple; no masses or thyromegaly. Lungs:  Clear throughout to auscultation.    Heart:  Regular rate and rhythm. Abdomen:  Soft, nontender and nondistended. Normal bowel sounds, without guarding, and without rebound.   Neurologic:  Alert and  oriented x4;  grossly normal neurologically.  Impression/Plan: Pam Hamilton is here for an colonoscopy to be performed for Screening colonoscopy average risk   Risks, benefits, limitations, and alternatives regarding  colonoscopy have been reviewed with the patient.  Questions have been answered.  All parties agreeable.   Jonathon Bellows, MD  11/18/2016, 8:45 AM

## 2016-11-18 NOTE — Transfer of Care (Signed)
Immediate Anesthesia Transfer of Care Note  Patient: Pam Hamilton  Procedure(s) Performed: Procedure(s): COLONOSCOPY WITH PROPOFOL (N/A)  Patient Location: PACU  Anesthesia Type:General  Level of Consciousness: awake, alert  and oriented  Airway & Oxygen Therapy: Patient Spontanous Breathing and Patient connected to nasal cannula oxygen  Post-op Assessment: Report given to RN and Post -op Vital signs reviewed and stable  Post vital signs: Reviewed and stable  Last Vitals:  Vitals:   11/18/16 0811 11/18/16 0912  BP: 119/76 103/63  Pulse: 93 76  Resp: 18 16  Temp: (!) 35.9 C 37.3 C    Last Pain:  Vitals:   11/18/16 0811  TempSrc: Tympanic         Complications: No apparent anesthesia complications

## 2016-11-18 NOTE — Anesthesia Postprocedure Evaluation (Signed)
Anesthesia Post Note  Patient: Pam Hamilton  Procedure(s) Performed: Procedure(s) (LRB): COLONOSCOPY WITH PROPOFOL (N/A)  Patient location during evaluation: Endoscopy Anesthesia Type: General Level of consciousness: awake and alert Pain management: pain level controlled Vital Signs Assessment: post-procedure vital signs reviewed and stable Respiratory status: spontaneous breathing, nonlabored ventilation, respiratory function stable and patient connected to nasal cannula oxygen Cardiovascular status: blood pressure returned to baseline and stable Postop Assessment: no signs of nausea or vomiting Anesthetic complications: no     Last Vitals:  Vitals:   11/18/16 0930 11/18/16 0940  BP: 119/73 98/64  Pulse: 80   Resp: 14 10  Temp:      Last Pain:  Vitals:   11/18/16 0811  TempSrc: Tympanic                 Tim Corriher S

## 2016-11-18 NOTE — Op Note (Signed)
[facility] Patient Name: Pam Hamilton Procedure Date: 11/18/2016 8:45 AM MRN: [id number] Account #: [id number] Date of Birth: [birth year] Admit Type: Outpatient Age: 51 Room: [facility] ENDO ROOM 4 Gender: Female Note Status: Finalized Procedure:            Colonoscopy Indications:          Screening for colorectal malignant neoplasm Providers:            Jonathon Bellows MD, MD Referring MD:         No Local Md, MD (Referring MD) Medicines:            Monitored Anesthesia Care Complications:        No immediate complications. Procedure:            Pre-Anesthesia Assessment:                       - Prior to the procedure, a History and Physical was                        performed, and patient medications, allergies and                        sensitivities were reviewed. The patient's tolerance of                        previous anesthesia was reviewed.                       - The risks and benefits of the procedure and the                        sedation options and risks were discussed with the                        patient. All questions were answered and informed                        consent was obtained.                       - ASA Grade Assessment: III - A patient with severe                        systemic disease.                       After obtaining informed consent, the colonoscope was                        passed under direct vision. Throughout the procedure,                        the patient's blood pressure, pulse, and oxygen                        saturations were monitored continuously. The                        Colonoscope was introduced through the anus and  advanced to the the cecum, identified by the                        appendiceal orifice, IC valve and transillumination.                        The colonoscopy was performed with ease. The patient                        tolerated the procedure  well. Findings:      The perianal and digital rectal examinations were normal.      Non-bleeding internal hemorrhoids were found during retroflexion. The       hemorrhoids were small and Grade I (internal hemorrhoids that do not       prolapse).      A few small-mouthed diverticula were found in the sigmoid colon.      The entire examined colon appeared normal on direct and retroflexion       views. Impression:           - Non-bleeding internal hemorrhoids.                       - Diverticulosis in the sigmoid colon.                       - The entire examined colon is normal on direct and                        retroflexion views.                       - No specimens collected. Recommendation:       - Discharge patient to home (with escort).                       - Resume previous diet today.                       - Repeat colonoscopy in 10 years for screening purposes. Procedure Code(s):    --- Professional ---                       A2130, Colorectal cancer screening; colonoscopy on                        individual not meeting criteria for high risk Diagnosis Code(s):    --- Professional ---                       Z12.11, Encounter for screening for malignant neoplasm                        of colon                       K64.0, First degree hemorrhoids                       K57.30, Diverticulosis of large intestine without                        perforation or abscess without bleeding CPT copyright 2016 American Medical Association. All rights  reserved. The codes documented in this report are preliminary and upon coder review may  be revised to meet current compliance requirements. Jonathon Bellows, MD Jonathon Bellows MD, MD 11/18/2016 9:09:44 AM This report has been signed electronically. Number of Addenda: 0 Note Initiated On: 11/18/2016 8:45 AM Scope Withdrawal Time: 0 hours 11 minutes 43 seconds  Total Procedure Duration: 0 hours 16 minutes 40 seconds       [facility]

## 2016-11-18 NOTE — Anesthesia Post-op Follow-up Note (Cosign Needed)
Anesthesia QCDR form completed.        

## 2016-11-22 ENCOUNTER — Encounter: Payer: Self-pay | Admitting: Gastroenterology

## 2017-01-12 ENCOUNTER — Telehealth: Payer: Self-pay

## 2017-01-12 NOTE — Telephone Encounter (Signed)
Pt calling c/o no menses in about 77m and for the past few days has been bleeding heavily.  Be seen or is this normal?  [phone].  Pt has mirena so she hasn't been having cycles.  She is using a tampon in addition to a pad but doesn't saturate pad q60min to 1hr.  She doesn't feel strings.  Appt made.

## 2017-01-17 ENCOUNTER — Ambulatory Visit (INDEPENDENT_AMBULATORY_CARE_PROVIDER_SITE_OTHER): Payer: 59 | Admitting: Obstetrics & Gynecology

## 2017-01-17 ENCOUNTER — Encounter: Payer: Self-pay | Admitting: Obstetrics & Gynecology

## 2017-01-17 VITALS — BP 100/60 | HR 75 | Ht 66.0 in | Wt 142.0 lb

## 2017-01-17 DIAGNOSIS — N926 Irregular menstruation, unspecified: Secondary | ICD-10-CM | POA: Insufficient documentation

## 2017-01-17 DIAGNOSIS — Z30431 Encounter for routine checking of intrauterine contraceptive device: Secondary | ICD-10-CM | POA: Diagnosis not present

## 2017-01-17 DIAGNOSIS — N951 Menopausal and female climacteric states: Secondary | ICD-10-CM | POA: Diagnosis not present

## 2017-01-17 DIAGNOSIS — N939 Abnormal uterine and vaginal bleeding, unspecified: Secondary | ICD-10-CM

## 2017-01-17 HISTORY — DX: Menopausal and female climacteric states: N95.1

## 2017-01-17 HISTORY — DX: Irregular menstruation, unspecified: N92.6

## 2017-01-17 NOTE — Progress Notes (Signed)
  History of Present Illness:  Pam Hamilton is a 51 y.o. that had a Mirena IUD placed approximately 3 yrs ago. Since that time, she states that she has had little bleeding until 2 episodes over the last month.  Intermittant hot flashes and insomnia.  Can not feel IUD string.  PMHx: She  has a past medical history of Depression; Multiple sclerosis ([place]); and Urine incontinence (2015). Also,  has a past surgical history that includes Polypectomy (2010); Ovary surgery (2015); Breast cyst aspiration; and Colonoscopy with propofol (N/A, 11/18/2016)., family history includes Alcohol abuse in her father; Anuerysm in her paternal grandmother; Breast cancer in her cousin and maternal aunt; Diabetes in her mother; Hyperlipidemia in her mother; Hypertension in her mother.,  reports that she has never smoked. She has never used smokeless tobacco. She reports that she drinks about 0.6 oz of alcohol per week . She reports that she does not use drugs. No outpatient prescriptions have been marked as taking for the 01/17/17 encounter (Office Visit) with Gae Dry, MD.  .  Also, is allergic to ciprofloxacin and septra [sulfamethoxazole-trimethoprim]..  Review of Systems  Constitutional: Negative for chills, fever and malaise/fatigue.  HENT: Negative for congestion, sinus pain and sore throat.   Eyes: Negative for blurred vision and pain.  Respiratory: Negative for cough and wheezing.   Cardiovascular: Negative for chest pain and leg swelling.  Gastrointestinal: Negative for abdominal pain, constipation, diarrhea, heartburn, nausea and vomiting.  Genitourinary: Negative for dysuria, frequency, hematuria and urgency.  Musculoskeletal: Negative for back pain, joint pain, myalgias and neck pain.  Skin: Negative for itching and rash.  Neurological: Negative for dizziness, tremors and weakness.  Endo/Heme/Allergies: Does not bruise/bleed easily.  Psychiatric/Behavioral: Negative for depression. The patient is  not nervous/anxious and does not have insomnia.     Physical Exam:  BP 100/60   Pulse 75   Ht 5\' 6"  (1.676 m)   Wt 142 lb (64.4 kg)   BMI 22.92 kg/m  Body mass index is 22.92 kg/m. Constitutional: Well nourished, well developed female in no acute distress.  Abdomen: diffusely non tender to palpation, non distended, and no masses, hernias Neuro: Grossly intact Psych:  Normal mood and affect.    Pelvic exam:  Two IUD strings present seen coming from the cervical os. EGBUS, vaginal vault and cervix: within normal limits  Assessment: IUD strings present in proper location DUB, perimenopausal Vasomotor sx's  Plan: [place] to assess for AUB and position of IUD Tx options for perimenopause discussed No tx necessary, just based on sx's  [place] soon  Barnett Applebaum, MD, [place], [facility] 01/17/2017  4:18 PM

## 2017-02-02 ENCOUNTER — Encounter: Payer: Self-pay | Admitting: Obstetrics & Gynecology

## 2017-02-02 ENCOUNTER — Ambulatory Visit (INDEPENDENT_AMBULATORY_CARE_PROVIDER_SITE_OTHER): Payer: 59 | Admitting: Obstetrics & Gynecology

## 2017-02-02 ENCOUNTER — Ambulatory Visit (INDEPENDENT_AMBULATORY_CARE_PROVIDER_SITE_OTHER): Payer: 59

## 2017-02-02 VITALS — BP 130/80 | HR 77 | Ht 66.0 in | Wt 143.0 lb

## 2017-02-02 DIAGNOSIS — N939 Abnormal uterine and vaginal bleeding, unspecified: Secondary | ICD-10-CM | POA: Diagnosis not present

## 2017-02-02 DIAGNOSIS — N951 Menopausal and female climacteric states: Secondary | ICD-10-CM | POA: Diagnosis not present

## 2017-02-02 NOTE — Progress Notes (Signed)
  HPI: Dysfunctional Uterine Bleeding Patient complains of irregular menses. She had been bleeding irregularly. She has had Mirena for 3 years, placed for irreg bleeding then.  Worked for about 2 years but now has almost daily spotting.  Had hot flashes and night sweats in past year but not recently.  [facility] for [facility].  Ultrasound demonstrates slight inferior displacement of IUD but otherwise in normal axis of uterus. No fibroids or evidence for endometrial thickening seen.   PMHx: She  has a past medical history of Depression; Multiple sclerosis ([place]); and Urine incontinence (2015). Also,  has a past surgical history that includes Polypectomy (2010); Ovary surgery (2015); Breast cyst aspiration; and Colonoscopy with propofol (N/A, 11/18/2016)., family history includes Alcohol abuse in her father; Anuerysm in her paternal grandmother; Breast cancer in her cousin and maternal aunt; Diabetes in her mother; Hyperlipidemia in her mother; Hypertension in her mother.,  reports that she has never smoked. She has never used smokeless tobacco. She reports that she drinks about 0.6 oz of alcohol per week . She reports that she does not use drugs.  She has a current medication list which includes the following prescription(s): black cohosh, cefdinir, cholecalciferol, fluticasone, gabapentin, levonorgestrel, and tecfidera. Also, is allergic to ciprofloxacin and septra [sulfamethoxazole-trimethoprim].  Review of Systems  All other systems reviewed and are negative.  Objective: BP 130/80   Pulse 77   Ht 5\' 6"  (1.676 m)   Wt 143 lb (64.9 kg)   BMI 23.08 kg/m   Physical examination Constitutional NAD, Conversant  Skin No rashes, lesions or ulceration.   Extremities: Moves all appropriately.  Normal ROM for age. No lymphadenopathy.  Neuro: Grossly intact  Psych: Oriented to PPT.  Normal mood. Normal affect.   Pelvic exam:  Two IUD strings present seen coming from the cervical os. EGBUS, vaginal vault and  cervix: within normal limits  IUD Removal Strings of IUD identified and grasped.  IUD removed without problem although it seemed a little embedded with some force to help it come out.  Pt tolerated this well.  IUD noted to be intact.  Assessment:  Irregular Menses IUD Removal  Plan: IUD removed and plan for contraception is vasectomy. She was amenable to this plan. Monitor for sx's of bleeding, menopause; treat accordingly.  Barnett Applebaum, MD, [facility], [facility] 02/02/2017  2:11 PM

## 2017-04-04 ENCOUNTER — Encounter: Payer: Self-pay | Admitting: Physician Assistant

## 2017-04-04 ENCOUNTER — Ambulatory Visit: Payer: Self-pay | Admitting: Physician Assistant

## 2017-04-04 VITALS — BP 110/72 | HR 87 | Temp 98.3°F

## 2017-04-04 DIAGNOSIS — J0100 Acute maxillary sinusitis, unspecified: Secondary | ICD-10-CM

## 2017-04-04 DIAGNOSIS — H6122 Impacted cerumen, left ear: Secondary | ICD-10-CM

## 2017-04-04 MED ORDER — PREDNISONE 10 MG PO TABS: 30.0000 mg | ORAL_TABLET | Freq: Every day | ORAL | 0 refills | Status: DC

## 2017-04-04 MED ORDER — AMOXICILLIN-POT CLAVULANATE 875-125 MG PO TABS: 1.0000 | ORAL_TABLET | Freq: Two times a day (BID) | ORAL | 0 refills | Status: DC

## 2017-04-04 MED ORDER — FLUCONAZOLE 150 MG PO TABS: ORAL_TABLET | ORAL | 0 refills | Status: DC

## 2017-04-04 NOTE — Progress Notes (Signed)
S: C/o complains of left ear pain for 2 days, can't hear well out of the ear, question if it's wax, used hydrogen peroxide couple of days ago, also states she has circles under her eyes and questions if she has a sinus infection, no fever, chills, cp/sob, v/d; mucus is green and thick, cough is sporadic,   Using otc meds:   O: PE: vitals wnl, nad, perrl eomi, normocephalic, left ear canal  Cerumen, left tm is normal, nasal mucosa red and swollen, throat injected, neck supple no lymph, lungs c t a, cv rrr, neuro intact, irrigated the left ear canal with warm water, wax is removed, left TM is red and swollen with some dullness  A:  Acute sinusitis, AOM, cerumen impaction  P: drink fluids, continue regular meds , use otc meds of choice, return if not improving in 5 days, return earlier if worsening , augmentin, pred 30mg  qd x 3d, diflucan 150mg 

## 2017-04-27 ENCOUNTER — Encounter: Payer: Self-pay | Admitting: Physician Assistant

## 2017-04-27 ENCOUNTER — Ambulatory Visit: Payer: Self-pay | Admitting: Physician Assistant

## 2017-04-27 VITALS — BP 120/70 | HR 92 | Temp 98.5°F

## 2017-04-27 DIAGNOSIS — J0120 Acute ethmoidal sinusitis, unspecified: Secondary | ICD-10-CM

## 2017-04-27 MED ORDER — CEFDINIR 300 MG PO CAPS: 300.0000 mg | ORAL_CAPSULE | Freq: Two times a day (BID) | ORAL | 0 refills | Status: AC

## 2017-04-27 MED ORDER — FEXOFENADINE-PSEUDOEPHED ER 60-120 MG PO TB12: 1.0000 | ORAL_TABLET | Freq: Two times a day (BID) | ORAL | 0 refills | Status: DC

## 2017-04-27 NOTE — Progress Notes (Signed)
   Subjective: Sinus congestion     Patient ID: Pam Hamilton, female    DOB: [birth year], 51 y.o.   MRN: [id number]  HPI Patient complaining 7-10 days of sinus congestion, upper teeth pain, frontal headache, postnasal drainage. Patient denies fever or chills associated with this complaint. Patient denies nausea, vomiting, diarrhea. No palliative measures for complaint.   Review of Systems Negative except for complaint    Objective:   Physical Exam HEENT remarkable for edematous nasal turbinates with thick greenish rhinorrhea. Patient has bilateral maxillary and frontal guarding. Erythematous pharynx postnasal drainage. Neck is supple without adenopathy. Lungs are clear to auscultation heart regular rate and rhythm.       Assessment & Plan: Subacute ethmoid sinusitis   Patient given discharge care instructions. Patient given a prescription for Omnicef Allegra-D. Patient advised follow-up PCP if no improvement in 5-7 days.

## 2017-05-22 ENCOUNTER — Encounter: Payer: Self-pay | Admitting: Obstetrics and Gynecology

## 2017-05-22 ENCOUNTER — Other Ambulatory Visit: Payer: Self-pay | Admitting: Obstetrics and Gynecology

## 2017-05-22 MED ORDER — GABAPENTIN 100 MG PO CAPS: 100.0000 mg | ORAL_CAPSULE | Freq: Every day | ORAL | 3 refills | Status: DC

## 2017-08-21 DIAGNOSIS — E538 Deficiency of other specified B group vitamins: Secondary | ICD-10-CM | POA: Diagnosis not present

## 2017-08-21 DIAGNOSIS — G35 Multiple sclerosis: Secondary | ICD-10-CM | POA: Diagnosis not present

## 2017-08-21 DIAGNOSIS — R200 Anesthesia of skin: Secondary | ICD-10-CM | POA: Diagnosis not present

## 2017-08-21 DIAGNOSIS — E559 Vitamin D deficiency, unspecified: Secondary | ICD-10-CM | POA: Diagnosis not present

## 2017-09-18 ENCOUNTER — Other Ambulatory Visit: Payer: Self-pay | Admitting: Obstetrics and Gynecology

## 2017-09-18 ENCOUNTER — Ambulatory Visit: Payer: 59 | Admitting: Obstetrics and Gynecology

## 2017-09-18 DIAGNOSIS — Z1231 Encounter for screening mammogram for malignant neoplasm of breast: Secondary | ICD-10-CM

## 2017-10-04 ENCOUNTER — Ambulatory Visit
Admission: RE | Admit: 2017-10-04 | Discharge: 2017-10-04 | Disposition: A | Discharge status: Home / Self Care | Payer: 59 | Source: Ambulatory Visit | Attending: Obstetrics and Gynecology | Admitting: Obstetrics and Gynecology

## 2017-10-04 DIAGNOSIS — Z1231 Encounter for screening mammogram for malignant neoplasm of breast: Secondary | ICD-10-CM | POA: Diagnosis not present

## 2017-10-05 ENCOUNTER — Encounter (INDEPENDENT_AMBULATORY_CARE_PROVIDER_SITE_OTHER): Payer: Self-pay

## 2017-11-02 ENCOUNTER — Ambulatory Visit (INDEPENDENT_AMBULATORY_CARE_PROVIDER_SITE_OTHER): Payer: 59 | Admitting: Internal Medicine

## 2017-11-02 ENCOUNTER — Encounter: Payer: Self-pay | Admitting: Internal Medicine

## 2017-11-02 VITALS — BP 108/80 | HR 77 | Temp 98.2°F | Ht 66.0 in | Wt 144.0 lb

## 2017-11-02 DIAGNOSIS — Z1159 Encounter for screening for other viral diseases: Secondary | ICD-10-CM | POA: Diagnosis not present

## 2017-11-02 DIAGNOSIS — G35 Multiple sclerosis: Secondary | ICD-10-CM | POA: Diagnosis not present

## 2017-11-02 DIAGNOSIS — Z0184 Encounter for antibody response examination: Secondary | ICD-10-CM

## 2017-11-02 DIAGNOSIS — Z0000 Encounter for general adult medical examination without abnormal findings: Secondary | ICD-10-CM

## 2017-11-02 DIAGNOSIS — D649 Anemia, unspecified: Secondary | ICD-10-CM | POA: Diagnosis not present

## 2017-11-02 DIAGNOSIS — Z1329 Encounter for screening for other suspected endocrine disorder: Secondary | ICD-10-CM | POA: Diagnosis not present

## 2017-11-02 DIAGNOSIS — E559 Vitamin D deficiency, unspecified: Secondary | ICD-10-CM

## 2017-11-02 DIAGNOSIS — Z13220 Encounter for screening for lipoid disorders: Secondary | ICD-10-CM | POA: Diagnosis not present

## 2017-11-02 DIAGNOSIS — Z1389 Encounter for screening for other disorder: Secondary | ICD-10-CM | POA: Diagnosis not present

## 2017-11-02 NOTE — Patient Instructions (Addendum)
Consider shingrix vaccine in future  Glucosamine chrondroitin supplements  Please sch fasting labs  F/u 1 year sooner if needed     Recombinant Zoster (Shingles) Vaccine, RZV: What You Need to Know 1. Why get vaccinated? Shingles (also called herpes zoster, or just zoster) is a painful skin rash, often with blisters. Shingles is caused by the varicella zoster virus, the same virus that causes chickenpox. After you have chickenpox, the virus stays in your body and can cause shingles later in life. You can't catch shingles from another person. However, a person who has never had chickenpox (or chickenpox vaccine) could get chickenpox from someone with shingles. A shingles rash usually appears on one side of the face or body and heals within 2 to 4 weeks. Its main symptom is pain, which can be severe. Other symptoms can include fever, headache, chills and upset stomach. Very rarely, a shingles infection can lead to pneumonia, hearing problems, blindness, brain inflammation (encephalitis), or death. For about 1 person in 5, severe pain can continue even long after the rash has cleared up. This long-lasting pain is called post-herpetic neuralgia (PHN). Shingles is far more common in people 70 years of age and older than in younger people, and the risk increases with age. It is also more common in people whose immune system is weakened because of a disease such as cancer, or by drugs such as steroids or chemotherapy. At least 1 million people a year in the [place] get shingles. 2. Shingles vaccine (recombinant) Recombinant shingles vaccine was approved by FDA in 2017 for the prevention of shingles. In clinical trials, it was more than 90% effective in preventing shingles. It can also reduce the likelihood of PHN. Two doses, 2 to 6 months apart, are recommended for adults 79 and older. This vaccine is also recommended for people who have already gotten the live shingles vaccine (Zostavax). There is  no live virus in this vaccine. 3. Some people should not get this vaccine Tell your vaccine provider if you:  Have any severe, life-threatening allergies. A person who has ever had a life-threatening allergic reaction after a dose of recombinant shingles vaccine, or has a severe allergy to any component of this vaccine, may be advised not to be vaccinated. Ask your health care provider if you want information about vaccine components.  Are pregnant or breastfeeding. There is not much information about use of recombinant shingles vaccine in pregnant or nursing women. Your healthcare provider might recommend delaying vaccination.  Are not feeling well. If you have a mild illness, such as a cold, you can probably get the vaccine today. If you are moderately or severely ill, you should probably wait until you recover. Your doctor can advise you.  4. Risks of a vaccine reaction With any medicine, including vaccines, there is a chance of reactions. After recombinant shingles vaccination, a person might experience:  Pain, redness, soreness, or swelling at the site of the injection  Headache, muscle aches, fever, shivering, fatigue  In clinical trials, most people got a sore arm with mild or moderate pain after vaccination, and some also had redness and swelling where they got the shot. Some people felt tired, had muscle pain, a headache, shivering, fever, stomach pain, or nausea. About 1 out of 6 people who got recombinant zoster vaccine experienced side effects that prevented them from doing regular activities. Symptoms went away on their own in about 2 to 3 days. Side effects were more common in younger people. You  should still get the second dose of recombinant zoster vaccine even if you had one of these reactions after the first dose. Other things that could happen after this vaccine:  People sometimes faint after medical procedures, including vaccination. Sitting or lying down for about 15  minutes can help prevent fainting and injuries caused by a fall. Tell your provider if you feel dizzy or have vision changes or ringing in the ears.  Some people get shoulder pain that can be more severe and longer-lasting than routine soreness that can follow injections. This happens very rarely.  Any medication can cause a severe allergic reaction. Such reactions to a vaccine are estimated at about 1 in a million doses, and would happen within a few minutes to a few hours after the vaccination. As with any medicine, there is a very remote chance of a vaccine causing a serious injury or death. The safety of vaccines is always being monitored. For more information, visit: http://www.aguilar.org/ 5. What if there is a serious problem? What should I look for?  Look for anything that concerns you, such as signs of a severe allergic reaction, very high fever, or unusual behavior. Signs of a severe allergic reaction can include hives, swelling of the face and throat, difficulty breathing, a fast heartbeat, dizziness, and weakness. These would usually start a few minutes to a few hours after the vaccination. What should I do?  If you think it is a severe allergic reaction or other emergency that can't wait, call 9-1-1 and get to the nearest hospital. Otherwise, call your health care provider. Afterward, the reaction should be reported to the Vaccine Adverse Event Reporting System (VAERS). Your doctor should file this report, or you can do it yourself through the VAERS web site atwww.vaers.https://www.bray.com/ by calling (346)486-3932. VAERS does not give medical advice. 6. How can I learn more?  Ask your healthcare provider. He or she can give you the vaccine package insert or suggest other sources of information.  Call your local or state health department.  Contact the Centers for Disease Control and Prevention (CDC): ? Call 828-884-6488 (1-800-CDC-INFO) or ? Visit the CDC's website at  http://hunter.com/ CDC Vaccine Information Statement (VIS) Recombinant Zoster Vaccine (06/27/2016) This information is not intended to replace advice given to you by your health care provider. Make sure you discuss any questions you have with your health care provider. Document Released: 07/12/2016 Document Revised: 07/12/2016 Document Reviewed: 07/12/2016 Elsevier Interactive Patient Education  [employer].

## 2017-11-02 NOTE — Progress Notes (Signed)
Pre visit review using our clinic review tool, if applicable. No additional management support is needed unless otherwise documented below in the visit note. 

## 2017-11-02 NOTE — Progress Notes (Addendum)
Chief Complaint  Patient presents with  . Transitions Of Care  . Annual Exam   Annual exam no complaints  1. H/o MS on tecfidera 240 bid with neurology per pt they want vitamin D level 50  2. Anemia labs 08/21/17 H/H 11.9/34.7 she reports IUD removed 01/2017 [facility] and since she has 1 cycle q 3 months and cycle in march was heavy she had IUD placed due to h/o heavy cycles 2/2 uterine/endometrial polyps which she has had removed.    Review of Systems  Constitutional: Negative for weight loss.  HENT: Negative for hearing loss.   Eyes: Negative for blurred vision.  Respiratory: Negative for shortness of breath.   Cardiovascular: Negative for chest pain.  Gastrointestinal: Negative for abdominal pain.  Genitourinary:       Heavy menses noted 07/2017   Musculoskeletal: Negative for falls.  Skin: Negative for rash.  Neurological: Negative for headaches.  Psychiatric/Behavioral: Negative for depression.   Past Medical History:  Diagnosis Date  . Depression    1997  . Multiple sclerosis ([place])   . Urine incontinence 2015   2 Episodes    Past Surgical History:  Procedure Laterality Date  . BREAST CYST ASPIRATION     neg  . COLONOSCOPY WITH PROPOFOL N/A 11/18/2016   Procedure: COLONOSCOPY WITH PROPOFOL;  Surgeon: Jonathon Bellows, MD;  Location: [facility] ENDOSCOPY;  Service: Endoscopy;  Laterality: N/A;  . OVARY SURGERY  2015   Right ovary removed.  Benighn cyst.  . POLYPECTOMY  2010   Removal of polyps from uterine.   Family History  Problem Relation Age of Onset  . Hyperlipidemia Mother   . Hypertension Mother   . Diabetes Mother   . Alcohol abuse Father   . Anuerysm Paternal Grandmother   . Breast cancer Maternal Aunt   . Breast cancer Cousin    Social History   Socioeconomic History  . Marital status: Married    Spouse name: Not on file  . Number of children: Not on file  . Years of education: Not on file  . Highest education level: Not on file  Occupational History  . Not  on file  Social Needs  . Financial resource strain: Not on file  . Food insecurity:    Worry: Not on file    Inability: Not on file  . Transportation needs:    Medical: Not on file    Non-medical: Not on file  Tobacco Use  . Smoking status: Never Smoker  . Smokeless tobacco: Never Used  Substance and Sexual Activity  . Alcohol use: Yes    Alcohol/week: 0.6 oz    Types: 1 Glasses of wine per week    Comment: Once a month   . Drug use: No  . Sexual activity: Not Currently    Birth control/protection: IUD  Lifestyle  . Physical activity:    Days per week: Not on file    Minutes per session: Not on file  . Stress: Not on file  Relationships  . Social connections:    Talks on phone: Not on file    Gets together: Not on file    Attends religious service: Not on file    Active member of club or organization: Not on file    Attends meetings of clubs or organizations: Not on file    Relationship status: Not on file  . Intimate partner violence:    Fear of current or ex partner: Not on file    Emotionally abused: Not  on file    Physically abused: Not on file    Forced sexual activity: Not on file  Other Topics Concern  . Not on file  Social History Narrative   Lives in [place] with Husband   2 children 59 and 17 both boys   1 Cat and 1 Rabbit   [employer]   Enjoys attending church, being with her boys       Current Meds  Medication Sig  . Cholecalciferol (VITAMIN D) 2000 units CAPS Take 2,000 Units by mouth daily.   Marland Kitchen gabapentin (NEURONTIN) 100 MG capsule Take 1 capsule (100 mg total) by mouth at bedtime.  . TECFIDERA 240 MG CPDR    Allergies  Allergen Reactions  . Ciprofloxacin Swelling    Cipro  . Septra [Sulfamethoxazole-Trimethoprim] Swelling   No results found for this or any previous visit (from the past 2160 hour(s)). Objective  Body mass index is 23.24 kg/m. Wt Readings from Last 3 Encounters:  11/02/17 144 lb (65.3 kg)  02/02/17  143 lb (64.9 kg)  01/17/17 142 lb (64.4 kg)   Temp Readings from Last 3 Encounters:  11/02/17 98.2 F (36.8 C) (Oral)  04/27/17 98.5 F (36.9 C)  04/04/17 98.3 F (36.8 C)   BP Readings from Last 3 Encounters:  11/02/17 108/80  04/27/17 120/70  04/04/17 110/72   Pulse Readings from Last 3 Encounters:  11/02/17 77  04/27/17 92  04/04/17 87    Physical Exam  Constitutional: She is oriented to person, place, and time. Vital signs are normal. She appears well-developed and well-nourished. She is cooperative.  HENT:  Head: Normocephalic and atraumatic.  Mouth/Throat: Oropharynx is clear and moist and mucous membranes are normal.  Eyes: Pupils are equal, round, and reactive to light. Conjunctivae are normal.  Cardiovascular: Normal rate, regular rhythm and normal heart sounds.  Pulmonary/Chest: Effort normal and breath sounds normal.  Neurological: She is alert and oriented to person, place, and time. Gait normal.  Skin: Skin is warm, dry and intact.  Psychiatric: She has a normal mood and affect. Her speech is normal and behavior is normal. Judgment and thought content normal. Cognition and memory are normal.  Nursing note and vitals reviewed.   Assessment   1. Annual exam  2. H/o MS 3. H/o anemia  Plan   1. Check labs with cone fasting  Had flu shot 02/2017  Tdap had 09/08/09  Disc shingrix today and given info pt wants to think about h/o shingles in past disc no DDI with MS medication Per pt MMR titer checked in the past and immune.    Pap and genetic testing need to get records [facility] no h/o abnormal pap per pt IUD removed 01/2017  -pap had 07/14/15 neg pap neg HPV my risk genetic result negative 09/03/13  mammo neg 10/04/17  Colonoscopy 11/18/16 diverticulosis/IH f/u due in 10 years  No need for dermatology referral at this time  2. F/u neurology doing well  3. Check CBC and iron labs    Provider: Dr. Olivia Mackie McLean-Scocuzza-[facility] Medicine

## 2017-11-07 ENCOUNTER — Other Ambulatory Visit
Admission: RE | Admit: 2017-11-07 | Discharge: 2017-11-07 | Disposition: A | Discharge status: Home / Self Care | Payer: 59 | Source: Ambulatory Visit | Attending: Internal Medicine | Admitting: Internal Medicine

## 2017-11-07 DIAGNOSIS — Z1389 Encounter for screening for other disorder: Secondary | ICD-10-CM | POA: Insufficient documentation

## 2017-11-07 DIAGNOSIS — Z0000 Encounter for general adult medical examination without abnormal findings: Secondary | ICD-10-CM | POA: Insufficient documentation

## 2017-11-07 DIAGNOSIS — Z0184 Encounter for antibody response examination: Secondary | ICD-10-CM | POA: Diagnosis not present

## 2017-11-07 DIAGNOSIS — D649 Anemia, unspecified: Secondary | ICD-10-CM | POA: Insufficient documentation

## 2017-11-07 DIAGNOSIS — Z13220 Encounter for screening for lipoid disorders: Secondary | ICD-10-CM | POA: Diagnosis not present

## 2017-11-07 DIAGNOSIS — E559 Vitamin D deficiency, unspecified: Secondary | ICD-10-CM | POA: Insufficient documentation

## 2017-11-07 DIAGNOSIS — G35 Multiple sclerosis: Secondary | ICD-10-CM | POA: Diagnosis not present

## 2017-11-07 DIAGNOSIS — Z1159 Encounter for screening for other viral diseases: Secondary | ICD-10-CM | POA: Insufficient documentation

## 2017-11-07 DIAGNOSIS — Z1329 Encounter for screening for other suspected endocrine disorder: Secondary | ICD-10-CM | POA: Insufficient documentation

## 2017-11-07 LAB — COMPREHENSIVE METABOLIC PANEL
ALBUMIN: 4.4 g/dL (ref 3.5–5.0)
ALT: 18 U/L (ref 0–44)
AST: 19 U/L (ref 15–41)
Alkaline Phosphatase: 65 U/L (ref 38–126)
Anion gap: 7 (ref 5–15)
BILIRUBIN TOTAL: 0.8 mg/dL (ref 0.3–1.2)
BUN: 17 mg/dL (ref 6–20)
CHLORIDE: 104 mmol/L (ref 98–111)
CO2: 27 mmol/L (ref 22–32)
CREATININE: 0.62 mg/dL (ref 0.44–1.00)
Calcium: 9.1 mg/dL (ref 8.9–10.3)
GFR calc Af Amer: >60 mL/min (ref >60)
GLUCOSE: 104 mg/dL — AB (ref 70–99)
POTASSIUM: 4.3 mmol/L (ref 3.5–5.1)
Sodium: 138 mmol/L (ref 135–145)
TOTAL PROTEIN: 7.6 g/dL (ref 6.5–8.1)

## 2017-11-07 LAB — CBC WITH DIFFERENTIAL/PLATELET
Basophils Absolute: 0 10*3/uL (ref 0–0.1)
Basophils Relative: 0 %
Eosinophils Absolute: 0.2 10*3/uL (ref 0–0.7)
Eosinophils Relative: 4 %
HEMATOCRIT: 37.3 % (ref 35.0–47.0)
HEMOGLOBIN: 13.2 g/dL (ref 12.0–16.0)
LYMPHS ABS: 1.2 10*3/uL (ref 1.0–3.6)
Lymphocytes Relative: 30 %
MCH: 32.8 pg (ref 26.0–34.0)
MCHC: 35.4 g/dL (ref 32.0–36.0)
MCV: 92.6 fL (ref 80.0–100.0)
MONO ABS: 0.4 10*3/uL (ref 0.2–0.9)
MONOS PCT: 10 %
NEUTROS ABS: 2.3 10*3/uL (ref 1.4–6.5)
NEUTROS PCT: 56 %
Platelets: 240 10*3/uL (ref 150–440)
RBC: 4.03 MIL/uL (ref 3.80–5.20)
RDW: 12 % (ref 11.5–14.5)
WBC: 4.2 10*3/uL (ref 3.6–11.0)

## 2017-11-07 LAB — URINALYSIS, ROUTINE W REFLEX MICROSCOPIC
BACTERIA UA: NONE SEEN
Bilirubin Urine: NEGATIVE
Glucose, UA: NEGATIVE mg/dL
Hgb urine dipstick: NEGATIVE
Ketones, ur: NEGATIVE mg/dL
NITRITE: NEGATIVE
Protein, ur: NEGATIVE mg/dL
Specific Gravity, Urine: 1.016 (ref 1.005–1.030)
pH: 6 (ref 5.0–8.0)

## 2017-11-07 LAB — LIPID PANEL
Cholesterol: 139 mg/dL (ref 0–200)
HDL: 80 mg/dL (ref >40)
LDL Cholesterol: 51 mg/dL (ref 0–99)
TRIGLYCERIDES: 38 mg/dL (ref <150)
Total CHOL/HDL Ratio: 1.7 RATIO
VLDL: 8 mg/dL (ref 0–40)

## 2017-11-07 LAB — IRON AND TIBC
Iron: 95 ug/dL (ref 28–170)
SATURATION RATIOS: 29 % (ref 10.4–31.8)
TIBC: 330 ug/dL (ref 250–450)
UIBC: 235 ug/dL

## 2017-11-07 LAB — T4, FREE: FREE T4: 0.85 ng/dL (ref 0.82–1.77)

## 2017-11-07 LAB — TSH: TSH: 3.236 u[IU]/mL (ref 0.350–4.500)

## 2017-11-07 LAB — FERRITIN: FERRITIN: 43 ng/mL (ref 11–307)

## 2017-11-08 LAB — VITAMIN D 25 HYDROXY (VIT D DEFICIENCY, FRACTURES): VIT D 25 HYDROXY: 55.3 ng/mL (ref 30.0–100.0)

## 2017-11-08 LAB — HEPATITIS B SURFACE ANTIBODY, QUANTITATIVE: HEPATITIS B-POST: 110 m[IU]/mL

## 2018-03-13 ENCOUNTER — Encounter: Payer: Self-pay | Admitting: Obstetrics and Gynecology

## 2018-03-15 ENCOUNTER — Ambulatory Visit (INDEPENDENT_AMBULATORY_CARE_PROVIDER_SITE_OTHER): Payer: 59 | Admitting: Obstetrics and Gynecology

## 2018-03-15 ENCOUNTER — Other Ambulatory Visit (HOSPITAL_COMMUNITY)
Admission: RE | Admit: 2018-03-15 | Discharge: 2018-03-15 | Disposition: A | Discharge status: Home / Self Care | Payer: 59 | Source: Ambulatory Visit | Attending: Obstetrics and Gynecology | Admitting: Obstetrics and Gynecology

## 2018-03-15 ENCOUNTER — Encounter: Payer: Self-pay | Admitting: Obstetrics and Gynecology

## 2018-03-15 VITALS — BP 116/68 | HR 65 | Ht 66.0 in | Wt 148.0 lb

## 2018-03-15 DIAGNOSIS — Z1239 Encounter for other screening for malignant neoplasm of breast: Secondary | ICD-10-CM

## 2018-03-15 DIAGNOSIS — N951 Menopausal and female climacteric states: Secondary | ICD-10-CM | POA: Diagnosis not present

## 2018-03-15 DIAGNOSIS — Z124 Encounter for screening for malignant neoplasm of cervix: Secondary | ICD-10-CM

## 2018-03-15 DIAGNOSIS — Z01419 Encounter for gynecological examination (general) (routine) without abnormal findings: Secondary | ICD-10-CM | POA: Insufficient documentation

## 2018-03-15 DIAGNOSIS — R4586 Emotional lability: Secondary | ICD-10-CM

## 2018-03-15 NOTE — Progress Notes (Signed)
Gynecology Annual Exam  PCP: McLean-Scocuzza, Nino Glow, MD  Chief Complaint:  Chief Complaint  Patient presents with  . Gynecologic Exam  . Menopause    Hot flashes/ Emotional    History of Present Illness:Patient is a 52 y.o. G2P2002 presents for annual exam. The patient has no complaints today.   LMP:has bleeding September 2018, followed by IUD removal, and then some light bleeding in March.  Has noted increased vasomotor symptoms and mood lability.  The patient is sexually active. She denies dyspareunia.  The patient does perform self breast exams.  There is notable family history of breast or ovarian cancer in her family.  The patient wears seatbelts: yes.   The patient has regular exercise: not asked.    The patient denies current symptoms of depression.     Review of Systems: Review of Systems  Constitutional: Positive for diaphoresis. Negative for chills and fever.  HENT: Negative for congestion.   Respiratory: Negative for cough and shortness of breath.   Cardiovascular: Negative for chest pain and palpitations.  Gastrointestinal: Negative for abdominal pain, constipation, diarrhea, heartburn, nausea and vomiting.  Genitourinary: Negative for dysuria, frequency and urgency.  Skin: Negative for itching and rash.  Neurological: Negative for dizziness and headaches.  Endo/Heme/Allergies: Negative for polydipsia.  Psychiatric/Behavioral: Negative for depression.    Past Medical History:  Past Medical History:  Diagnosis Date  . Depression    1997  . Endometrial polyp   . Multiple sclerosis ([place])   . Shingles    right upper arm ? year 2015 or prior   . Urine incontinence 2015   2 Episodes     Past Surgical History:  Past Surgical History:  Procedure Laterality Date  . BREAST CYST ASPIRATION     neg  . CESAREAN SECTION    . COLONOSCOPY WITH PROPOFOL N/A 11/18/2016   Procedure: COLONOSCOPY WITH PROPOFOL;  Surgeon: Jonathon Bellows, MD;  Location: [facility] ENDOSCOPY;   Service: Endoscopy;  Laterality: N/A;  . INTRAUTERINE DEVICE (IUD) INSERTION  01/02/2014   Mirena  . OVARY SURGERY  6/252015   Right ovary removed.  Benign cyst.  . POLYPECTOMY  2008,11/07/13   Removal of polyps from uterine. Vernie Ammons)  . SALPINGECTOMY  11/07/2013   Right.Marland KitchenMarland KitchenHemorrhagic cyst  . Sonohysterogram  05/07/2007   [facility]    Gynecologic History:  Patient's last menstrual period was 01/26/2018. Last Pap: Results were: 07/14/2015 no abnormalities  Last mammogram: 10/04/2017 Results were: BI-RAD I  Obstetric History: [id number]  Family History:  Family History  Problem Relation Age of Onset  . Hyperlipidemia Mother   . Hypertension Mother   . Diabetes Mother   . Alcohol abuse Father   . Anuerysm Paternal Grandmother   . Breast cancer Maternal Aunt   . Breast cancer Cousin   . Cancer Other        FH breast (aunt, cousin, 2nd cousin<40 y.o); h/o ovarian cancer     Social History:  Social History   Socioeconomic History  . Marital status: Married    Spouse name: Not on file  . Number of children: Not on file  . Years of education: Not on file  . Highest education level: Not on file  Occupational History  . Not on file  Social Needs  . Financial resource strain: Not on file  . Food insecurity:    Worry: Not on file    Inability: Not on file  . Transportation needs:    Medical: Not on file  Non-medical: Not on file  Tobacco Use  . Smoking status: Never Smoker  . Smokeless tobacco: Never Used  Substance and Sexual Activity  . Alcohol use: Yes    Alcohol/week: 1.0 standard drinks    Types: 1 Glasses of wine per week    Comment: Once a month   . Drug use: No  . Sexual activity: Not Currently    Birth control/protection: None  Lifestyle  . Physical activity:    Days per week: Not on file    Minutes per session: Not on file  . Stress: Not on file  Relationships  . Social connections:    Talks on phone: Not on file    Gets together: Not on file     Attends religious service: Not on file    Active member of club or organization: Not on file    Attends meetings of clubs or organizations: Not on file    Relationship status: Not on file  . Intimate partner violence:    Fear of current or ex partner: Not on file    Emotionally abused: Not on file    Physically abused: Not on file    Forced sexual activity: Not on file  Other Topics Concern  . Not on file  Social History Narrative   Lives in [place] with Husband   2 children 66 and 17 both boys   1 Cat and 1 Rabbit   [employer]   Enjoys attending church, being with her boys        Allergies:  Allergies  Allergen Reactions  . Ciprofloxacin Swelling    Cipro  . Septra [Sulfamethoxazole-Trimethoprim] Swelling    Medications: Prior to Admission medications   Medication Sig Start Date End Date Taking? Authorizing Provider  Cholecalciferol (VITAMIN D) 2000 units CAPS Take 2,000 Units by mouth daily.     [provider]  gabapentin (NEURONTIN) 100 MG capsule Take 1 capsule (100 mg total) by mouth at bedtime. 05/22/17   Malachy Mood, MD  TECFIDERA 240 MG CPDR 2 (two) times daily.  06/23/16   [provider]    Physical Exam Vitals: Blood pressure 116/68, pulse 65, height 5\' 6"  (1.676 m), weight 148 lb (67.1 kg), last menstrual period 01/26/2018.  General: NAD HEENT: normocephalic, anicteric Thyroid: no enlargement, no palpable nodules Pulmonary: No increased work of breathing, CTAB Cardiovascular: RRR, distal pulses 2+ Breast: Breast symmetrical, no tenderness, no palpable nodules or masses, no skin or nipple retraction present, no nipple discharge.  No axillary or supraclavicular lymphadenopathy. Abdomen: NABS, soft, non-tender, non-distended.  Umbilicus without lesions.  No hepatomegaly, splenomegaly or masses palpable. No evidence of hernia  Genitourinary:  External: Normal external female genitalia.  Normal urethral meatus, normal  Bartholin's and Skene's glands.    Vagina: Normal vaginal mucosa, no evidence of prolapse.    Cervix: Grossly normal in appearance, no bleeding  Uterus: Non-enlarged, mobile, normal contour.  No CMT  Adnexa: ovaries non-enlarged, no adnexal masses  Rectal: deferred  Lymphatic: no evidence of inguinal lymphadenopathy Extremities: no edema, erythema, or tenderness Neurologic: Grossly intact Psychiatric: mood appropriate, affect full  Female chaperone present for pelvic and breast  portions of the physical exam     Assessment: 52 y.o. G2P2002 routine annual exam  Plan: Problem List Items Addressed This Visit    None    Visit Diagnoses    Vasomotor symptoms due to menopause    -  Primary   Relevant Orders   Follicle stimulating hormone  Estradiol   Emotional lability       Relevant Orders   Follicle stimulating hormone   Estradiol   Encounter for gynecological examination without abnormal finding       Relevant Orders   Cytology - PAP   Screening for malignant neoplasm of cervix       Relevant Orders   Cytology - PAP   Breast screening          1) Mammogram - recommend yearly screening mammogram.  Mammogram Was ordered today  2) STI screening  wasoffered and therefore not obtained  3) ASCCP guidelines and rational discussed.  Patient opts for every 3 years screening interval  4) Osteoporosis  - per USPTF routine screening DEXA at age 40  5) Routine healthcare maintenance including cholesterol, diabetes screening discussed managed by PCP  6) Colonoscopy up to date 11/18/2016  7) Vasomotor symptoms - had improvement with gabapentin.  May restart.  We discussed option including hormonal and non-hormonal.  If mood lability persists after improvement in vasomotor symptoms consider starting SSRI.  TSH checked earlier this year at PCP and normal.  We discussed WHI study findings in detail.  In the combined estrogen-progesterone arm breast cancer risk was increased by 1.26  (CI of 1.00 to 1.59), coronary heart disease 1.29 (CI 1.02-1.63), stroke risk 1.41 (1.07-1.85), and pulmonary embolism 2.13 (CI 1.39-3.25).  That being said the while statistically significant the actual number of cases attributable are relatively small at an addition 8 cases of breast cancer, 7 more coronary artery event, 8 more strokes, and 8 additional case of pulmonary embolism per 10,000 women.  Study was terminated because of the increased breast cancer risk, this was not seen in the progestin only arm of the study for women without an intact uterus.  In addition it is important to note that HRT also had positive or risk reducing effects, and all cause mortality between the HRT/non-HRT users is not statistically different.  Estrogen-progestin HRT decreased the relative risk of hip fracture 0.66 (CI 0.45-0.98), colorectal cancer 0.63 (0.43-0.92).  Current consensus is to limit dose to the lowest effective dose, and shortest treatment duration possible.  Breast cancer risk appeared to increase after 4 years of use.  Also important to note is that these risk refer to systemic HRT for the treatment of vasomotor symptoms, and do not apply to vaginal preperations with minimal systemic absorption and aimed at treating symptoms of vulvovaginal atrophy.    We briefly touched on findings of WHIMS trial published in 2005 which looked at women 36 year of age or older, and whether HRT was protective against the development of dementia.  The study revealed that HRT actually increased the risk for the development of dementia but was limited by looking only at patients 20 years of age and older.  The subsequent KEEPS trial  In 2012 which looked at HRT in recently postmenopausal women did not show any improvement in cognitive function for women on HRT.  However, there was also no significant cognitive declines seen in recently postmenopausal women receiving HRT as had previously been shown in the WHIMS trial.    8 Return  in about 1 year (around 03/16/2019) for annual.    Malachy Mood, MD Mosetta Pigeon, [place] 03/15/2018, 3:25 PM

## 2018-03-15 NOTE — Patient Instructions (Signed)
We discussed WHI study findings in detail.  In the combined estrogen-progesterone arm breast cancer risk was increased by 1.26 (CI of 1.00 to 1.59), coronary heart disease 1.29 (CI 1.02-1.63), stroke risk 1.41 (1.07-1.85), and pulmonary embolism 2.13 (CI 1.39-3.25).  That being said the while statistically significant the actual number of cases attributable are relatively small at an addition 8 cases of breast cancer, 7 more coronary artery event, 8 more strokes, and 8 additional case of pulmonary embolism per 10,000 women.  Study was terminated because of the increased breast cancer risk, this was not seen in the progestin only arm of the study for women without an intact uterus.  In addition it is important to note that HRT also had positive or risk reducing effects, and all cause mortality between the HRT/non-HRT users is not statistically different.  Estrogen-progestin HRT decreased the relative risk of hip fracture 0.66 (CI 0.45-0.98), colorectal cancer 0.63 (0.43-0.92).  Current consensus is to limit dose to the lowest effective dose, and shortest treatment duration possible.  Breast cancer risk appeared to increase after 4 years of use.  Also important to note is that these risk refer to systemic HRT for the treatment of vasomotor symptoms, and do not apply to vaginal preperations with minimal systemic absorption and aimed at treating symptoms of vulvovaginal atrophy.    We briefly touched on findings of WHIMS trial published in 2005 which looked at women 30 year of age or older, and whether HRT was protective against the development of dementia.  The study revealed that HRT actually increased the risk for the development of dementia but was limited by looking only at patients 69 years of age and older.  The subsequent KEEPS trial  In 2012 which looked at HRT in recently postmenopausal women did not show any improvement in cognitive function for women on HRT.  However, there was also no significant  cognitive declines seen in recently postmenopausal women receiving HRT as had previously been shown in the WHIMS trial.     Non-hormonal options gabapentin (Neurontin), paroxetine (Brisdelle), clonidine (Catapres)

## 2018-03-16 DIAGNOSIS — Z803 Family history of malignant neoplasm of breast: Secondary | ICD-10-CM

## 2018-03-16 HISTORY — DX: Family history of malignant neoplasm of breast: Z80.3

## 2018-03-16 LAB — ESTRADIOL: Estradiol: <5 pg/mL

## 2018-03-16 LAB — FOLLICLE STIMULATING HORMONE: FSH: 71 m[IU]/mL

## 2018-03-20 LAB — CYTOLOGY - PAP
DIAGNOSIS: NEGATIVE
HPV (WINDOPATH): NOT DETECTED

## 2018-04-02 ENCOUNTER — Encounter: Payer: Self-pay | Admitting: Obstetrics and Gynecology

## 2018-09-24 ENCOUNTER — Other Ambulatory Visit: Payer: Self-pay | Admitting: Obstetrics and Gynecology

## 2018-09-24 DIAGNOSIS — Z1239 Encounter for other screening for malignant neoplasm of breast: Secondary | ICD-10-CM

## 2018-09-26 ENCOUNTER — Other Ambulatory Visit: Payer: Self-pay | Admitting: Obstetrics and Gynecology

## 2018-09-26 DIAGNOSIS — Z1231 Encounter for screening mammogram for malignant neoplasm of breast: Secondary | ICD-10-CM

## 2018-10-11 ENCOUNTER — Ambulatory Visit
Admission: RE | Admit: 2018-10-11 | Discharge: 2018-10-11 | Disposition: A | Discharge status: Home / Self Care | Payer: 59 | Source: Ambulatory Visit | Attending: Obstetrics and Gynecology | Admitting: Obstetrics and Gynecology

## 2018-10-11 ENCOUNTER — Other Ambulatory Visit: Payer: Self-pay

## 2018-10-11 DIAGNOSIS — Z1231 Encounter for screening mammogram for malignant neoplasm of breast: Secondary | ICD-10-CM | POA: Insufficient documentation

## 2018-10-24 DIAGNOSIS — G35 Multiple sclerosis: Secondary | ICD-10-CM | POA: Diagnosis not present

## 2018-11-05 ENCOUNTER — Other Ambulatory Visit: Payer: Self-pay

## 2018-11-05 ENCOUNTER — Encounter: Payer: Self-pay | Admitting: Pharmacist

## 2018-11-05 ENCOUNTER — Encounter: Payer: Self-pay | Admitting: Internal Medicine

## 2018-11-05 ENCOUNTER — Ambulatory Visit: Payer: 59 | Attending: Family Medicine | Admitting: Pharmacist

## 2018-11-05 DIAGNOSIS — G35 Multiple sclerosis: Secondary | ICD-10-CM

## 2018-11-05 DIAGNOSIS — Z79899 Other long term (current) drug therapy: Secondary | ICD-10-CM

## 2018-11-05 MED ORDER — AVONEX 30 MCG IM KIT: PACK | INTRAMUSCULAR | 1 refills | Status: DC

## 2018-11-05 NOTE — Progress Notes (Signed)
S: Patient presents for review of their specialty medication therapy.  Patient is currently taking Avonex for multiple sclerosis (MS). Patient is managed by Dr. Melrose Nakayama for this.   Adherence: has not started; Dr. Melrose Nakayama is d/cing Tecfidera and starting Avonex. Pt has taken Avonex in the past.   Efficacy: has not started; see above.   Dosing: IM: to decrease flu-like symptoms, may initiate once-weekly dosing with 7.5 mcg (week 1) then increase dose in increments of 7.5 mcg once weekly (weeks 2-4) up to the recommended dose of 30 mcg once weekly.   Dose adjustments: Renal: no dose adjustments - has not been studied Hepatic: There are no dosage adjustment provided in the manufacturer's labeling; use with caution in patients with active liver disease, alcohol abuse, ALT >2.5 x ULN, or a history of significant liver disease. [employer] [place] labeling contraindicates use in decompensated liver disease.  Dose adjustments for toxicities: Autoimmune disorder development: Consider discontinuing treatment. Depression or other severe psychiatric symptoms: Consider discontinuing treatment. Hepatotoxicity: ALT >5 x ULN: Temporarily discontinue therapy or consider dose reduction until ALT normalizes, then may consider retitration of dose. Symptomatic (eg, jaundice): Discontinue immediately. Leukopenia: May require temporary discontinuation or dose reduction until resolution  Drug-drug interactions: no significant DDIs identified.   Monitoring: CBC: last taken 11/07/17; WNL LFTs:  last taken 11/07/17; WNL Thyroid function tests:  last taken 11/07/17; WNL Injection site reactions: denies having these in the past S/sx of malignancy: denies having these in the past Neuropsychiatric symptoms: denies having these in the past Thrombotic microangiopathy (monitor for new onset HTN, thrombocytopenia, or impaired renal dysfunction): denies having these in the past   O:     Lab Results  Component Value Date    WBC 4.2 11/07/2017   HGB 13.2 11/07/2017   HCT 37.3 11/07/2017   MCV 92.6 11/07/2017   PLT 240 11/07/2017      Chemistry      Component Value Date/Time   NA 138 11/07/2017 0755   NA 138 07/19/2012 1435   K 4.3 11/07/2017 0755   K 4.1 07/19/2012 1435   CL 104 11/07/2017 0755   CL 104 07/19/2012 1435   CO2 27 11/07/2017 0755   CO2 28 07/19/2012 1435   BUN 17 11/07/2017 0755   BUN 11 07/19/2012 1435   CREATININE 0.62 11/07/2017 0755   CREATININE 0.76 07/19/2012 1435      Component Value Date/Time   CALCIUM 9.1 11/07/2017 0755   CALCIUM 8.8 07/19/2012 1435   ALKPHOS 65 11/07/2017 0755   AST 19 11/07/2017 0755   ALT 18 11/07/2017 0755   BILITOT 0.8 11/07/2017 0755       Lab Results  Component Value Date   TSH 3.236 11/07/2017     A/P: 1. Medication review: Patient is not currently on Avonex for the treatment of multiple sclerosis but will start once obtaining from pharmacy.  She reports tolerating it in the past. Reviewed the medication with the patient, including the following: Avonex, interferon beta-1a, is an interferon indicated for the treatment of MS. Patient educated on purpose, proper use and potential adverse effects of Avonex. Possible adverse effects include flu-like symptoms, mood changes, GI upset, headache, and injection site reactions. Hypersensitivity, autoimmune disorders, bone marrow suppression, hepatic effects, and thrombotic microangiopathy have been reported. Use with caution in preexisting cardiovascular disease and seizure disorders. Thyroid dysfunction may occur (monitor thyroid function as needed). Analgesics and/or antipyretics may help decrease flu-like symptoms on treatment days. No recommendations for any changes at this  time.   Benard Halsted, PharmD, [place] [phone]

## 2018-11-06 ENCOUNTER — Encounter: Payer: Self-pay | Admitting: Internal Medicine

## 2018-11-14 MED FILL — AVONEX PEN 30 MCG/0.5 ML KI: 30 | 28 days supply | Qty: 1 | Fill #0

## 2018-11-15 ENCOUNTER — Other Ambulatory Visit: Payer: Self-pay | Admitting: Pharmacist

## 2018-11-15 MED ORDER — AVONEX PREFILLED 30 MCG/0.5ML IM PSKT: PREFILLED_SYRINGE | INTRAMUSCULAR | 1 refills | Status: DC

## 2018-11-21 MED FILL — AVONEX PREFILLED SYR 30 MCG: 30 | 28 days supply | Qty: 1 | Fill #0

## 2018-12-04 ENCOUNTER — Encounter: Payer: 59 | Admitting: Internal Medicine

## 2018-12-12 MED FILL — AVONEX PREFILLED SYR 30 MCG: 30 | 28 days supply | Qty: 1 | Fill #1

## 2019-01-08 ENCOUNTER — Other Ambulatory Visit: Payer: Self-pay

## 2019-01-10 ENCOUNTER — Other Ambulatory Visit: Payer: Self-pay

## 2019-01-10 ENCOUNTER — Ambulatory Visit (INDEPENDENT_AMBULATORY_CARE_PROVIDER_SITE_OTHER): Payer: 59 | Admitting: Internal Medicine

## 2019-01-10 VITALS — BP 104/70 | HR 96 | Temp 98.6°F | Resp 16 | Ht 66.0 in | Wt 148.2 lb

## 2019-01-10 DIAGNOSIS — Z1389 Encounter for screening for other disorder: Secondary | ICD-10-CM | POA: Diagnosis not present

## 2019-01-10 DIAGNOSIS — Z0000 Encounter for general adult medical examination without abnormal findings: Secondary | ICD-10-CM | POA: Diagnosis not present

## 2019-01-10 DIAGNOSIS — Z1329 Encounter for screening for other suspected endocrine disorder: Secondary | ICD-10-CM

## 2019-01-10 DIAGNOSIS — E538 Deficiency of other specified B group vitamins: Secondary | ICD-10-CM

## 2019-01-10 DIAGNOSIS — Z13220 Encounter for screening for lipoid disorders: Secondary | ICD-10-CM

## 2019-01-10 DIAGNOSIS — R739 Hyperglycemia, unspecified: Secondary | ICD-10-CM

## 2019-01-10 DIAGNOSIS — F419 Anxiety disorder, unspecified: Secondary | ICD-10-CM

## 2019-01-10 NOTE — Patient Instructions (Addendum)
Debrox ear wax drops  4-7 days 1x per month    L theanine Stress relax brand 100-200 mg daily   Menopause Menopause is the normal time of life when menstrual periods stop completely. It is usually confirmed by 12 months without a menstrual period. The transition to menopause (perimenopause) most often happens between the ages of 75 and 31. During perimenopause, hormone levels change in your body, which can cause symptoms and affect your health. Menopause may increase your risk for:  Loss of bone (osteoporosis), which causes bone breaks (fractures).  Depression.  Hardening and narrowing of the arteries (atherosclerosis), which can cause heart attacks and strokes. What are the causes? This condition is usually caused by a natural change in hormone levels that happens as you get older. The condition may also be caused by surgery to remove both ovaries (bilateral oophorectomy). What increases the risk? This condition is more likely to start at an earlier age if you have certain medical conditions or treatments, including:  A tumor of the pituitary gland in the brain.  A disease that affects the ovaries and hormone production.  Radiation treatment for cancer.  Certain cancer treatments, such as chemotherapy or hormone (anti-estrogen) therapy.  Heavy smoking and excessive alcohol use.  Family history of early menopause. This condition is also more likely to develop earlier in women who are very thin. What are the signs or symptoms? Symptoms of this condition include:  Hot flashes.  Irregular menstrual periods.  Night sweats.  Changes in feelings about sex. This could be a decrease in sex drive or an increased comfort around your sexuality.  Vaginal dryness and thinning of the vaginal walls. This may cause painful intercourse.  Dryness of the skin and development of wrinkles.  Headaches.  Problems sleeping (insomnia).  Mood swings or irritability.  Memory  problems.  Weight gain.  Hair growth on the face and chest.  Bladder infections or problems with urinating. How is this diagnosed? This condition is diagnosed based on your medical history, a physical exam, your age, your menstrual history, and your symptoms. Hormone tests may also be done. How is this treated? In some cases, no treatment is needed. You and your health care provider should make a decision together about whether treatment is necessary. Treatment will be based on your individual condition and preferences. Treatment for this condition focuses on managing symptoms. Treatment may include:  Menopausal hormone therapy (MHT).  Medicines to treat specific symptoms or complications.  Acupuncture.  Vitamin or herbal supplements. Before starting treatment, make sure to let your health care provider know if you have a personal or family history of:  Heart disease.  Breast cancer.  Blood clots.  Diabetes.  Osteoporosis. Follow these instructions at home: Lifestyle  Do not use any products that contain nicotine or tobacco, such as cigarettes and e-cigarettes. If you need help quitting, ask your health care provider.  Get at least 30 minutes of physical activity on 5 or more days each week.  Avoid alcoholic and caffeinated beverages, as well as spicy foods. This may help prevent hot flashes.  Get 7-8 hours of sleep each night.  If you have hot flashes, try: ? Dressing in layers. ? Avoiding things that may trigger hot flashes, such as spicy food, warm places, or stress. ? Taking slow, deep breaths when a hot flash starts. ? Keeping a fan in your home and office.  Find ways to manage stress, such as deep breathing, meditation, or journaling.  Consider going  to group therapy with other women who are having menopause symptoms. Ask your health care provider about recommended group therapy meetings. Eating and drinking  Eat a healthy, balanced diet that contains whole  grains, lean protein, low-fat dairy, and plenty of fruits and vegetables.  Your health care provider may recommend adding more soy to your diet. Foods that contain soy include tofu, tempeh, and soy milk.  Eat plenty of foods that contain calcium and vitamin D for bone health. Items that are rich in calcium include low-fat milk, yogurt, beans, almonds, sardines, broccoli, and kale. Medicines  Take over-the-counter and prescription medicines only as told by your health care provider.  Talk with your health care provider before starting any herbal supplements. If prescribed, take vitamins and supplements as told by your health care provider. These may include: ? Calcium. Women age 74 and older should get 1,200 mg (milligrams) of calcium every day. ? Vitamin D. Women need 600-800 International Units of vitamin D each day. ? Vitamins B12 and B6. Aim for 50 micrograms of B12 and 1.5 mg of B6 each day. General instructions  Keep track of your menstrual periods, including: ? When they occur. ? How heavy they are and how long they last. ? How much time passes between periods.  Keep track of your symptoms, noting when they start, how often you have them, and how long they last.  Use vaginal lubricants or moisturizers to help with vaginal dryness and improve comfort during sex.  Keep all follow-up visits as told by your health care provider. This is important. This includes any group therapy or counseling. Contact a health care provider if:  You are still having menstrual periods after age 39.  You have pain during sex.  You have not had a period for 12 months and you develop vaginal bleeding. Get help right away if:  You have: ? Severe depression. ? Excessive vaginal bleeding. ? Pain when you urinate. ? A fast or irregular heart beat (palpitations). ? Severe headaches. ? Abdomen (abdominal) pain or severe indigestion.  You fell and you think you have a broken bone.  You develop leg  or chest pain.  You develop vision problems.  You feel a lump in your breast. Summary  Menopause is the normal time of life when menstrual periods stop completely. It is usually confirmed by 12 months without a menstrual period.  The transition to menopause (perimenopause) most often happens between the ages of 53 and 27.  Symptoms can be managed through medicines, lifestyle changes, and complementary therapies such as acupuncture.  Eat a balanced diet that is rich in nutrients to promote bone health and heart health and to manage symptoms during menopause. This information is not intended to replace advice given to you by your health care provider. Make sure you discuss any questions you have with your health care provider. Document Released: 07/23/2003 Document Revised: 04/14/2017 Document Reviewed: 06/04/2016 Elsevier Patient Education  2020 [place].  Hydroxyzine capsules or tablets What is this medicine? HYDROXYZINE (hye Burns i zeen) is an antihistamine. This medicine is used to treat allergy symptoms. It is also used to treat anxiety and tension. This medicine can be used with other medicines to induce sleep before surgery. This medicine may be used for other purposes; ask your health care provider or pharmacist if you have questions. COMMON BRAND NAME(S): ANX, Atarax, Rezine, Vistaril What should I tell my health care provider before I take this medicine? They need to know if you  have any of these conditions:  glaucoma  heart disease  history of irregular heartbeat  kidney disease  liver disease  lung or breathing disease, like asthma  stomach or intestine problems  thyroid disease  trouble passing urine  an unusual or allergic reaction to hydroxyzine, cetirizine, other medicines, foods, dyes or preservatives  pregnant or trying to get pregnant  breast-feeding How should I use this medicine? Take this medicine by mouth with a full glass of water. Follow  the directions on the prescription label. You may take this medicine with food or on an empty stomach. Take your medicine at regular intervals. Do not take your medicine more often than directed. Talk to your pediatrician regarding the use of this medicine in children. Special care may be needed. While this drug may be prescribed for children as young as 68 years of age for selected conditions, precautions do apply. Patients over 61 years old may have a stronger reaction and need a smaller dose. Overdosage: If you think you have taken too much of this medicine contact a poison control center or emergency room at once. NOTE: This medicine is only for you. Do not share this medicine with others. What if I miss a dose? If you miss a dose, take it as soon as you can. If it is almost time for your next dose, take only that dose. Do not take double or extra doses. What may interact with this medicine? Do not take this medicine with any of the following medications:  cisapride  dronedarone  pimozide  thioridazine This medicine may also interact with the following medications:  alcohol  antihistamines for allergy, cough, and cold  atropine  barbiturate medicines for sleep or seizures, like phenobarbital  certain antibiotics like erythromycin or clarithromycin  certain medicines for anxiety or sleep  certain medicines for bladder problems like oxybutynin, tolterodine  certain medicines for depression or psychotic disturbances  certain medicines for irregular heart beat  certain medicines for Parkinson's disease like benztropine, trihexyphenidyl  certain medicines for seizures like phenobarbital, primidone  certain medicines for stomach problems like dicyclomine, hyoscyamine  certain medicines for travel sickness like scopolamine  ipratropium  narcotic medicines for pain  other medicines that prolong the QT interval (an abnormal heart rhythm) like dofetilide This list may not  describe all possible interactions. Give your health care provider a list of all the medicines, herbs, non-prescription drugs, or dietary supplements you use. Also tell them if you smoke, drink alcohol, or use illegal drugs. Some items may interact with your medicine. What should I watch for while using this medicine? Tell your doctor or health care professional if your symptoms do not improve. You may get drowsy or dizzy. Do not drive, use machinery, or do anything that needs mental alertness until you know how this medicine affects you. Do not stand or sit up quickly, especially if you are an older patient. This reduces the risk of dizzy or fainting spells. Alcohol may interfere with the effect of this medicine. Avoid alcoholic drinks. Your mouth may get dry. Chewing sugarless gum or sucking hard candy, and drinking plenty of water may help. Contact your doctor if the problem does not go away or is severe. This medicine may cause dry eyes and blurred vision. If you wear contact lenses you may feel some discomfort. Lubricating drops may help. See your eye doctor if the problem does not go away or is severe. If you are receiving skin tests for allergies, tell your doctor  you are using this medicine. What side effects may I notice from receiving this medicine? Side effects that you should report to your doctor or health care professional as soon as possible:  allergic reactions like skin rash, itching or hives, swelling of the face, lips, or tongue  changes in vision  confusion  fast, irregular heartbeat  seizures  tremor  trouble passing urine or change in the amount of urine Side effects that usually do not require medical attention (report to your doctor or health care professional if they continue or are bothersome):  constipation  drowsiness  dry mouth  headache  tiredness This list may not describe all possible side effects. Call your doctor for medical advice about side  effects. You may report side effects to FDA at 1-800-FDA-1088. Where should I keep my medicine? Keep out of the reach of children. Store at room temperature between 15 and 30 degrees C (59 and 86 degrees F). Keep container tightly closed. Throw away any unused medicine after the expiration date. NOTE: This sheet is a summary. It may not cover all possible information. If you have questions about this medicine, talk to your doctor, pharmacist, or health care provider.  2020 Elsevier/Gold Standard (2018-04-23 13:19:55)  Venlafaxine tablets What is this medicine? VENLAFAXINE (VEN la fax een) is used to treat depression, anxiety and panic disorder. This medicine may be used for other purposes; ask your health care provider or pharmacist if you have questions. COMMON BRAND NAME(S): Effexor What should I tell my health care provider before I take this medicine? They need to know if you have any of these conditions:  bleeding disorders  glaucoma  heart disease  high blood pressure  high cholesterol  kidney disease  liver disease  low levels of sodium in the blood  mania or bipolar disorder  seizures  suicidal thoughts, plans, or attempt; a previous suicide attempt by you or a family  take medicines that treat or prevent blood clots  thyroid disease  an unusual or allergic reaction to venlafaxine, desvenlafaxine, other medicines, foods, dyes, or preservatives  pregnant or trying to get pregnant  breast-feeding How should I use this medicine? Take this medicine by mouth with a glass of water. Follow the directions on the prescription label. Take it with food. Take your medicine at regular intervals. Do not take your medicine more often than directed. Do not stop taking this medicine suddenly except upon the advice of your doctor. Stopping this medicine too quickly may cause serious side effects or your condition may worsen. A special MedGuide will be given to you by the  pharmacist with each prescription and refill. Be sure to read this information carefully each time. Talk to your pediatrician regarding the use of this medicine in children. Special care may be needed. Overdosage: If you think you have taken too much of this medicine contact a poison control center or emergency room at once. NOTE: This medicine is only for you. Do not share this medicine with others. What if I miss a dose? If you miss a dose, take it as soon as you can. If it is almost time for your next dose, take only that dose. Do not take double or extra doses. What may interact with this medicine? Do not take this medicine with any of the following medications:  certain medicines for fungal infections like fluconazole, itraconazole, ketoconazole, posaconazole, voriconazole  cisapride  desvenlafaxine  dronedarone  duloxetine  levomilnacipran  linezolid  MAOIs like Carbex, Eldepryl,  Marplan, Nardil, and Parnate  methylene blue (injected into a vein)  milnacipran  pimozide  thioridazine This medicine may also interact with the following medications:  amphetamines  aspirin and aspirin-like medicines  certain medicines for depression, anxiety, or psychotic disturbances  certain medicines for migraine headaches like almotriptan, eletriptan, frovatriptan, naratriptan, rizatriptan, sumatriptan, zolmitriptan  certain medicines for sleep  certain medicines that treat or prevent blood clots like dalteparin, enoxaparin, warfarin  cimetidine  clozapine  diuretics  fentanyl  furazolidone  indinavir  isoniazid  lithium  metoprolol  NSAIDS, medicines for pain and inflammation, like ibuprofen or naproxen  other medicines that prolong the QT interval (cause an abnormal heart rhythm) like dofetilide, ziprasidone  procarbazine  rasagiline  supplements like St. John's wort, kava kava, valerian  tramadol  tryptophan This list may not describe all possible  interactions. Give your health care provider a list of all the medicines, herbs, non-prescription drugs, or dietary supplements you use. Also tell them if you smoke, drink alcohol, or use illegal drugs. Some items may interact with your medicine. What should I watch for while using this medicine? Tell your doctor if your symptoms do not get better or if they get worse. Visit your doctor or health care professional for regular checks on your progress. Because it may take several weeks to see the full effects of this medicine, it is important to continue your treatment as prescribed by your doctor. Patients and their families should watch out for new or worsening thoughts of suicide or depression. Also watch out for sudden changes in feelings such as feeling anxious, agitated, panicky, irritable, hostile, aggressive, impulsive, severely restless, overly excited and hyperactive, or not being able to sleep. If this happens, especially at the beginning of treatment or after a change in dose, call your health care professional. This medicine can cause an increase in blood pressure. Check with your doctor for instructions on monitoring your blood pressure while taking this medicine. You may get drowsy or dizzy. Do not drive, use machinery, or do anything that needs mental alertness until you know how this medicine affects you. Do not stand or sit up quickly, especially if you are an older patient. This reduces the risk of dizzy or fainting spells. Alcohol may interfere with the effect of this medicine. Avoid alcoholic drinks. Your mouth may get dry. Chewing sugarless gum, sucking hard candy and drinking plenty of water will help. Contact your doctor if the problem does not go away or is severe. What side effects may I notice from receiving this medicine? Side effects that you should report to your doctor or health care professional as soon as possible:  allergic reactions like skin rash, itching or hives,  swelling of the face, lips, or tongue  anxious  breathing problems  confusion  changes in vision  chest pain  confusion  elevated mood, decreased need for sleep, racing thoughts, impulsive behavior  eye pain  fast, irregular heartbeat  feeling faint or lightheaded, falls  feeling agitated, angry, or irritable  hallucination, loss of contact with reality  high blood pressure  loss of balance or coordination  palpitations  redness, blistering, peeling or loosening of the skin, including inside the mouth  restlessness, pacing, inability to keep still  seizures  stiff muscles  suicidal thoughts or other mood changes  trouble passing urine or change in the amount of urine  trouble sleeping  unusual bleeding or bruising  unusually weak or tired  vomiting Side effects that usually do  not require medical attention (report to your doctor or health care professional if they continue or are bothersome):  change in sex drive or performance  change in appetite or weight  constipation  dizziness  dry mouth  headache  increased sweating  nausea  tired This list may not describe all possible side effects. Call your doctor for medical advice about side effects. You may report side effects to FDA at 1-800-FDA-1088. Where should I keep my medicine? Keep out of the reach of children. Store at a controlled temperature between 20 and 25 degrees C (68 and 77 degrees F), in a dry place. Throw away any unused medicine after the expiration date. NOTE: This sheet is a summary. It may not cover all possible information. If you have questions about this medicine, talk to your doctor, pharmacist, or health care provider.  2020 Elsevier/Gold Standard (2018-04-24 12:08:23)  Shingrix vaccine  Zoster Vaccine, Recombinant injection What is this medicine? ZOSTER VACCINE (ZOS ter vak SEEN) is used to prevent shingles in adults 52 years old and over. This vaccine is not  used to treat shingles or nerve pain from shingles. This medicine may be used for other purposes; ask your health care provider or pharmacist if you have questions. COMMON BRAND NAME(S): [facility] What should I tell my health care provider before I take this medicine? They need to know if you have any of these conditions:  blood disorders or disease  cancer like leukemia or lymphoma  immune system problems or therapy  an unusual or allergic reaction to vaccines, other medications, foods, dyes, or preservatives  pregnant or trying to get pregnant  breast-feeding How should I use this medicine? This vaccine is for injection in a muscle. It is given by a health care professional. Talk to your pediatrician regarding the use of this medicine in children. This medicine is not approved for use in children. Overdosage: If you think you have taken too much of this medicine contact a poison control center or emergency room at once. NOTE: This medicine is only for you. Do not share this medicine with others. What if I miss a dose? Keep appointments for follow-up (booster) doses as directed. It is important not to miss your dose. Call your doctor or health care professional if you are unable to keep an appointment. What may interact with this medicine?  medicines that suppress your immune system  medicines to treat cancer  steroid medicines like prednisone or cortisone This list may not describe all possible interactions. Give your health care provider a list of all the medicines, herbs, non-prescription drugs, or dietary supplements you use. Also tell them if you smoke, drink alcohol, or use illegal drugs. Some items may interact with your medicine. What should I watch for while using this medicine? Visit your doctor for regular check ups. This vaccine, like all vaccines, may not fully protect everyone. What side effects may I notice from receiving this medicine? Side effects that you should  report to your doctor or health care professional as soon as possible:  allergic reactions like skin rash, itching or hives, swelling of the face, lips, or tongue  breathing problems Side effects that usually do not require medical attention (report these to your doctor or health care professional if they continue or are bothersome):  chills  headache  fever  nausea, vomiting  redness, warmth, pain, swelling or itching at site where injected  tiredness This list may not describe all possible side effects. Call your doctor  for medical advice about side effects. You may report side effects to FDA at 1-800-FDA-1088. Where should I keep my medicine? This vaccine is only given in a clinic, pharmacy, doctor's office, or other health care setting and will not be stored at home. NOTE: This sheet is a summary. It may not cover all possible information. If you have questions about this medicine, talk to your doctor, pharmacist, or health care provider.  2020 Elsevier/Gold Standard (2016-12-12 13:20:30)

## 2019-01-10 NOTE — Progress Notes (Signed)
Chief Complaint  Patient presents with  . Annual Exam   Annual doing well except for some increased stress and anxiety  1. Anxiety she reports was on avonex stopped 3 weeks ago b/c having increased anxiety and reduced concentration but better since back on tecifidera per Dr. Melrose Nakayama  -she was on prozac in the past but did not like the way it made her feel   2. MS controlled f/u with Dr. Melrose Nakayama annuall    Review of Systems  Constitutional: Negative for weight loss.  HENT: Negative for hearing loss.   Eyes: Negative for blurred vision.  Respiratory: Negative for shortness of breath.   Cardiovascular: Negative for chest pain.  Gastrointestinal: Negative for abdominal pain.  Musculoskeletal: Negative for falls.  Skin: Negative for rash.  Neurological: Negative for headaches.  Psychiatric/Behavioral: The patient is nervous/anxious.    Past Medical History:  Diagnosis Date  . Depression    1997  . Endometrial polyp   . Family history of breast cancer 03/2018   cancer genetic testing letter sent  . Family history of ovarian cancer   . Multiple sclerosis ([place])   . Shingles    right upper arm ? year 2015 or prior   . Urine incontinence 2015   2 Episodes    Past Surgical History:  Procedure Laterality Date  . BREAST CYST ASPIRATION     neg  . CESAREAN SECTION    . COLONOSCOPY WITH PROPOFOL N/A 11/18/2016   Procedure: COLONOSCOPY WITH PROPOFOL;  Surgeon: Jonathon Bellows, MD;  Location: [facility] ENDOSCOPY;  Service: Endoscopy;  Laterality: N/A;  . INTRAUTERINE DEVICE (IUD) INSERTION  01/02/2014   Mirena  . OVARY SURGERY  6/252015   Right ovary removed.  Benign cyst.  . POLYPECTOMY  2008,11/07/13   Removal of polyps from uterine. Vernie Ammons)  . SALPINGECTOMY  11/07/2013   Right.Marland KitchenMarland KitchenHemorrhagic cyst  . Sonohysterogram  05/07/2007   VanDalen   Family History  Problem Relation Age of Onset  . Hyperlipidemia Mother   . Hypertension Mother   . Diabetes Mother   . Alcohol abuse Father   .  Anuerysm Paternal Grandmother   . Breast cancer Maternal Aunt 14  . Breast cancer Cousin 30  . Ovarian cancer Other 71   Social History   Socioeconomic History  . Marital status: Married    Spouse name: Not on file  . Number of children: Not on file  . Years of education: Not on file  . Highest education level: Not on file  Occupational History  . Not on file  Social Needs  . Financial resource strain: Not on file  . Food insecurity    Worry: Not on file    Inability: Not on file  . Transportation needs    Medical: Not on file    Non-medical: Not on file  Tobacco Use  . Smoking status: Never Smoker  . Smokeless tobacco: Never Used  Substance and Sexual Activity  . Alcohol use: Yes    Alcohol/week: 1.0 standard drinks    Types: 1 Glasses of wine per week    Comment: Once a month   . Drug use: No  . Sexual activity: Not Currently    Birth control/protection: None  Lifestyle  . Physical activity    Days per week: Not on file    Minutes per session: Not on file  . Stress: Not on file  Relationships  . Social Herbalist on phone: Not on file    Gets  together: Not on file    Attends religious service: Not on file    Active member of club or organization: Not on file    Attends meetings of clubs or organizations: Not on file    Relationship status: Not on file  . Intimate partner violence    Fear of current or ex partner: Not on file    Emotionally abused: Not on file    Physically abused: Not on file    Forced sexual activity: Not on file  Other Topics Concern  . Not on file  Social History Narrative   Lives in [place] with Husband   2 children 92 and 17 both boys   1 Cat and 1 Rabbit   [employer]   Enjoys attending church, being with her boys       No outpatient medications have been marked as taking for the 01/10/19 encounter (Office Visit) with McLean-Scocuzza, Nino Glow, MD.   Allergies  Allergen Reactions  . Ciprofloxacin  Swelling    Cipro  . Septra [Sulfamethoxazole-Trimethoprim] Swelling   No results found for this or any previous visit (from the past 2160 hour(s)). Objective  Body mass index is 23.92 kg/m. Wt Readings from Last 3 Encounters:  01/10/19 148 lb 3.2 oz (67.2 kg)  03/15/18 148 lb (67.1 kg)  11/02/17 144 lb (65.3 kg)   Temp Readings from Last 3 Encounters:  01/10/19 98.6 F (37 C)  11/02/17 98.2 F (36.8 C) (Oral)  04/27/17 98.5 F (36.9 C)   BP Readings from Last 3 Encounters:  01/10/19 104/70  03/15/18 116/68  11/02/17 108/80   Pulse Readings from Last 3 Encounters:  01/10/19 96  03/15/18 65  11/02/17 77    Physical Exam Vitals signs and nursing note reviewed.  Constitutional:      Appearance: Normal appearance. She is well-developed and well-groomed.  HENT:     Head: Normocephalic and atraumatic.     Comments: +mask on   Cardiovascular:     Rate and Rhythm: Normal rate and regular rhythm.     Heart sounds: Normal heart sounds.  Pulmonary:     Effort: Pulmonary effort is normal.     Breath sounds: Normal breath sounds.  Skin:    General: Skin is warm and dry.  Neurological:     General: No focal deficit present.     Mental Status: She is alert and oriented to person, place, and time.     Gait: Gait normal.  Psychiatric:        Attention and Perception: Attention and perception normal.        Mood and Affect: Mood and affect normal.        Speech: Speech normal.        Behavior: Behavior normal. Behavior is cooperative.        Thought Content: Thought content normal.        Cognition and Memory: Cognition and memory normal.        Judgment: Judgment normal.     Assessment   1. Annual  2. Anxiety/stress Plan  Check labs with [facility] fasting  Had flu shot 02/2017 will get at work  Tdap had 09/08/09  Disc shingrix again  - and given info pt wants to think about h/o shingles in past disc DDI with MS medication will check with Dr. Melrose Nakayama   Per pt MMR titer  checked in the past and immune.    Pap and genetic testing need to get records [facility]  03/15/18 pap negative   mammo neg 10/11/18 negative   Colonoscopy 11/18/16 diverticulosis/IH f/u due in 10 years  No need for dermatology referral at this time   2. Disc L theanine, effexor vs hydroxyzine as options she will my chart to see if she wants to start these  Given info today   Provider: Dr. Olivia Mackie McLean-Scocuzza-Internal Medicine

## 2019-01-10 NOTE — Addendum Note (Signed)
Addended by: Orland Mustard on: 01/10/2019 03:41 PM   Modules accepted: Orders

## 2019-01-16 ENCOUNTER — Other Ambulatory Visit
Admission: RE | Admit: 2019-01-16 | Discharge: 2019-01-16 | Disposition: A | Discharge status: Home / Self Care | Payer: 59 | Source: Ambulatory Visit | Attending: Internal Medicine | Admitting: Internal Medicine

## 2019-01-16 ENCOUNTER — Other Ambulatory Visit: Payer: Self-pay

## 2019-01-16 ENCOUNTER — Encounter: Payer: Self-pay | Admitting: Internal Medicine

## 2019-01-16 DIAGNOSIS — Z13220 Encounter for screening for lipoid disorders: Secondary | ICD-10-CM

## 2019-01-16 DIAGNOSIS — Z1329 Encounter for screening for other suspected endocrine disorder: Secondary | ICD-10-CM | POA: Diagnosis not present

## 2019-01-16 DIAGNOSIS — E538 Deficiency of other specified B group vitamins: Secondary | ICD-10-CM | POA: Diagnosis not present

## 2019-01-16 DIAGNOSIS — Z1389 Encounter for screening for other disorder: Secondary | ICD-10-CM | POA: Diagnosis not present

## 2019-01-16 DIAGNOSIS — R7303 Prediabetes: Secondary | ICD-10-CM | POA: Insufficient documentation

## 2019-01-16 DIAGNOSIS — F419 Anxiety disorder, unspecified: Secondary | ICD-10-CM

## 2019-01-16 DIAGNOSIS — R739 Hyperglycemia, unspecified: Secondary | ICD-10-CM | POA: Diagnosis not present

## 2019-01-16 DIAGNOSIS — Z0000 Encounter for general adult medical examination without abnormal findings: Secondary | ICD-10-CM | POA: Diagnosis not present

## 2019-01-16 LAB — CBC WITH DIFFERENTIAL/PLATELET
Abs Immature Granulocytes: 0.04 10*3/uL (ref 0.00–0.07)
Basophils Absolute: 0 10*3/uL (ref 0.0–0.1)
Basophils Relative: 1 %
Eosinophils Absolute: 0.1 10*3/uL (ref 0.0–0.5)
Eosinophils Relative: 3 %
HCT: 34 % — ABNORMAL LOW (ref 36.0–46.0)
Hemoglobin: 11.8 g/dL — ABNORMAL LOW (ref 12.0–15.0)
Immature Granulocytes: 1 %
Lymphocytes Relative: 30 %
Lymphs Abs: 1.3 10*3/uL (ref 0.7–4.0)
MCH: 32.2 pg (ref 26.0–34.0)
MCHC: 34.7 g/dL (ref 30.0–36.0)
MCV: 92.9 fL (ref 80.0–100.0)
Monocytes Absolute: 0.4 10*3/uL (ref 0.1–1.0)
Monocytes Relative: 10 %
Neutro Abs: 2.4 10*3/uL (ref 1.7–7.7)
Neutrophils Relative %: 55 %
Platelets: 283 10*3/uL (ref 150–400)
RBC: 3.66 MIL/uL — ABNORMAL LOW (ref 3.87–5.11)
RDW: 11.9 % (ref 11.5–15.5)
WBC: 4.2 10*3/uL (ref 4.0–10.5)
nRBC: 0 % (ref 0.0–0.2)

## 2019-01-16 LAB — URINALYSIS, ROUTINE W REFLEX MICROSCOPIC
Bacteria, UA: NONE SEEN
Bilirubin Urine: NEGATIVE
Glucose, UA: NEGATIVE mg/dL
Hgb urine dipstick: NEGATIVE
Ketones, ur: NEGATIVE mg/dL
Nitrite: NEGATIVE
Protein, ur: NEGATIVE mg/dL
Specific Gravity, Urine: 1.017 (ref 1.005–1.030)
pH: 6 (ref 5.0–8.0)

## 2019-01-16 LAB — COMPREHENSIVE METABOLIC PANEL
ALT: 20 U/L (ref 0–44)
AST: 18 U/L (ref 15–41)
Albumin: 4.2 g/dL (ref 3.5–5.0)
Alkaline Phosphatase: 72 U/L (ref 38–126)
Anion gap: 10 (ref 5–15)
BUN: 20 mg/dL (ref 6–20)
CO2: 27 mmol/L (ref 22–32)
Calcium: 9.2 mg/dL (ref 8.9–10.3)
Chloride: 101 mmol/L (ref 98–111)
Creatinine, Ser: 0.69 mg/dL (ref 0.44–1.00)
GFR calc Af Amer: >60 mL/min (ref >60)
GFR calc non Af Amer: >60 mL/min (ref >60)
Glucose, Bld: 98 mg/dL (ref 70–99)
Potassium: 4.3 mmol/L (ref 3.5–5.1)
Sodium: 138 mmol/L (ref 135–145)
Total Bilirubin: 0.8 mg/dL (ref 0.3–1.2)
Total Protein: 7.3 g/dL (ref 6.5–8.1)

## 2019-01-16 LAB — LIPID PANEL
Cholesterol: 142 mg/dL (ref 0–200)
HDL: 77 mg/dL (ref >40)
LDL Cholesterol: 52 mg/dL (ref 0–99)
Total CHOL/HDL Ratio: 1.8 RATIO
Triglycerides: 64 mg/dL (ref <150)
VLDL: 13 mg/dL (ref 0–40)

## 2019-01-16 LAB — TSH: TSH: 3.207 u[IU]/mL (ref 0.350–4.500)

## 2019-01-16 LAB — VITAMIN B12: Vitamin B-12: 610 pg/mL (ref 180–914)

## 2019-01-16 LAB — HEMOGLOBIN A1C
Hgb A1c MFr Bld: 5.8 % — ABNORMAL HIGH (ref 4.8–5.6)
Mean Plasma Glucose: 119.76 mg/dL

## 2019-04-24 DIAGNOSIS — G35 Multiple sclerosis: Secondary | ICD-10-CM | POA: Diagnosis not present

## 2019-04-24 DIAGNOSIS — E559 Vitamin D deficiency, unspecified: Secondary | ICD-10-CM | POA: Diagnosis not present

## 2019-04-24 DIAGNOSIS — Z79899 Other long term (current) drug therapy: Secondary | ICD-10-CM | POA: Diagnosis not present

## 2019-05-29 ENCOUNTER — Encounter: Payer: Self-pay | Admitting: Obstetrics and Gynecology

## 2019-05-29 ENCOUNTER — Ambulatory Visit (INDEPENDENT_AMBULATORY_CARE_PROVIDER_SITE_OTHER): Payer: 59 | Admitting: Obstetrics and Gynecology

## 2019-05-29 ENCOUNTER — Other Ambulatory Visit: Payer: Self-pay

## 2019-05-29 VITALS — BP 133/71 | HR 97 | Ht 66.0 in | Wt 150.0 lb

## 2019-05-29 DIAGNOSIS — N950 Postmenopausal bleeding: Secondary | ICD-10-CM | POA: Diagnosis not present

## 2019-05-29 NOTE — Progress Notes (Signed)
Gynecology H&P  Chief Complaint:  Chief Complaint  Patient presents with  . Vaginal Bleeding    post menopausal bleeding x 10 days w/clots    History of Present Illness: Patient is a 54 y.o. [id number] presents evaluation of postmenopausal bleeding. The patient states she has had a single episode(s) of bleeding in the past 1 week(s).  The most recent episode occurred  1  week(s) ago.  The bleeding has been normal menstrual flow, duration 10 days . She describes the blood as Bright red in appearance.  She has had no additional complaints. She deniestrauma or other inciting event, bloating, cramping, early satiety and abdominal pain. She does not have a history of abnormal pap smears.  Her last pap smear was on 03/15/2018 and was read as NIL HPV negative.  The patient is currently sexually active and has noted postcoital bleeding There are no other aggravating factors reported. There are no alleviating factors reported. The patient's past medical history is noncontributory.   Had IUD removed 02/02/2017  She has nothad prior work up for postmenopausal bleeding.  Review of Systems: 10 point review of systems negative unless otherwise noted in HPI  Past Medical History:  Past Medical History:  Diagnosis Date  . Depression    1997  . Endometrial polyp   . Family history of breast cancer 03/2018   cancer genetic testing letter sent  . Family history of ovarian cancer   . Multiple sclerosis ([place])   . Shingles    right upper arm ? year 2015 or prior   . Urine incontinence 2015   2 Episodes     Past Surgical History:  Past Surgical History:  Procedure Laterality Date  . BREAST CYST ASPIRATION     neg  . CESAREAN SECTION    . COLONOSCOPY WITH PROPOFOL N/A 11/18/2016   Procedure: COLONOSCOPY WITH PROPOFOL;  Surgeon: Jonathon Bellows, MD;  Location: [facility] ENDOSCOPY;  Service: Endoscopy;  Laterality: N/A;  . INTRAUTERINE DEVICE (IUD) INSERTION  01/02/2014   Mirena  . OVARY SURGERY  6/252015   Right ovary removed.  Benign cyst.  . POLYPECTOMY  2008,11/07/13   Removal of polyps from uterine. Vernie Ammons)  . SALPINGECTOMY  11/07/2013   Right.Marland KitchenMarland KitchenHemorrhagic cyst  . Sonohysterogram  05/07/2007   VanDalen    Family History:  Family History  Problem Relation Age of Onset  . Hyperlipidemia Mother   . Hypertension Mother   . Diabetes Mother   . Alcohol abuse Father   . Anuerysm Paternal Grandmother   . Breast cancer Maternal Aunt 17  . Breast cancer Cousin 30  . Ovarian cancer Other 38    Social History:  Social History   Socioeconomic History  . Marital status: Married    Spouse name: Not on file  . Number of children: Not on file  . Years of education: Not on file  . Highest education level: Not on file  Occupational History  . Not on file  Tobacco Use  . Smoking status: Never Smoker  . Smokeless tobacco: Never Used  Substance and Sexual Activity  . Alcohol use: Yes    Alcohol/week: 1.0 standard drinks    Types: 1 Glasses of wine per week    Comment: Once a month   . Drug use: No  . Sexual activity: Yes    Birth control/protection: None  Other Topics Concern  . Not on file  Social History Narrative   Lives in [place] with Husband   2 children 15 and  64 both boys   1 Cat and 1 Rabbit   [employer]   Enjoys attending church, being with her boys       Social Determinants of Health   Financial Resource Strain:   . Difficulty of Paying Living Expenses: Not on file  Food Insecurity:   . Worried About Charity fundraiser in the Last Year: Not on file  . Ran Out of Food in the Last Year: Not on file  Transportation Needs:   . Lack of Transportation (Medical): Not on file  . Lack of Transportation (Non-Medical): Not on file  Physical Activity:   . Days of Exercise per Week: Not on file  . Minutes of Exercise per Session: Not on file  Stress:   . Feeling of Stress : Not on file  Social Connections:   . Frequency of Communication with  Friends and Family: Not on file  . Frequency of Social Gatherings with Friends and Family: Not on file  . Attends Religious Services: Not on file  . Active Member of Clubs or Organizations: Not on file  . Attends Archivist Meetings: Not on file  . Marital Status: Not on file  Intimate Partner Violence:   . Fear of Current or Ex-Partner: Not on file  . Emotionally Abused: Not on file  . Physically Abused: Not on file  . Sexually Abused: Not on file    Allergies:  Allergies  Allergen Reactions  . Ciprofloxacin Swelling    Cipro  . Septra [Sulfamethoxazole-Trimethoprim] Swelling    Medications: Prior to Admission medications   Medication Sig Start Date End Date Taking? Authorizing Provider  Cholecalciferol (VITAMIN D) 2000 units CAPS Take 2,000 Units by mouth daily.     [provider]  gabapentin (NEURONTIN) 100 MG capsule Take 1 capsule (100 mg total) by mouth at bedtime. 05/22/17   Malachy Mood, MD  TECFIDERA 240 MG CPDR 2 (two) times daily.  06/23/16   [provider]    Physical Exam Vitals: Blood pressure 133/71, pulse 97, height 5' 6"  (1.676 m), weight 150 lb (68 kg).  General: NAD HEENT: normocephalic, anicteric Neck: Thyroid non-enlarged, no nodules Pulmonary: No increased work of breathing Abdomen: NABS, soft, non-tender, non-distended.  Umbilicus without lesions.  No hepatomegaly, splenomegaly or masses palpable. No evidence of hernia  Genitourinary:  External: Normal external female genitalia.  Normal urethral meatus, normal  Bartholin's and Skene's glands.    Vagina: Normal vaginal mucosa, no evidence of prolapse.    Cervix: Grossly normal in appearance, no bleeding  Uterus: Non-enlarged, mobile, normal contour.  No CMT  Adnexa: ovaries non-enlarged, no adnexal masses  Rectal: deferred Extremities: no edema, erythema, or tenderness Neurologic: Grossly intact Psychiatric: mood appropriate, affect full  Assessment: 54 y.o. [id number]  presenting for evaluation of postmenopausal bleeding  Plan: Problem List Items Addressed This Visit    None    Visit Diagnoses    Postmenopausal bleeding    -  Primary   Relevant Orders   US Transvaginal Non-OB      1) We discussed that menopause is a clinical diagnosis made after 12 months of amenorrhea.  The average age of menopause in the  [place] population is 1 but there may be significant variation.  Any bleeding that happens after a 12 month period of amenorrhea warrants further work.  Possible etiologies of postmenopausal bleeding were discussed with the patient today.  These may range from benign etiologies such as urethral prolapse and  atrophy, to indeterminate lesions such as submucosal fibroids or polyps which would require resection to accurately evaluate. The role of unopposed estrogen in the development of  dndometrial hyperplasia or carcinoma is discussed.  The risk of endometrial hyperplasia is linearly correlated with increasing BMI given the production of estrone by adipose tissue.  Work up will be include transvaginal ultrasound to assess the thickness of the endometrial lining as well as to assess for focal uterine lesions.  Negative ultrasound evaluation, defined as the absence of focal lesions and endometrial stripe of <77m, effectively rules out carcinoma and confirms atrophy as the most likely etiology.  Should focal lesions be present these generally require hysteroscopic resection.  Should lining be greater >442mendometrial biopsy is warranted to rule out hyperplasia or frank endometrial cancer.  Continued episodes of bleeding despite negative ultrasound also warrant endometrial sampling.  As the cervical pathology may also be implicated in postmenopausal bleeding prior cervical cytology was reviewed and repeated if required per ASCCP guidelines.   2) Evaluation by pelvc ultrasound scheduled, with follow up after [place] discussed and may be performed as well. Pros and  cons of these modalities of testing discussed.  - will check  check [facility] for prior BRCA test results  3) Return in about 1 week (around 06/05/2019) for 1-2 week TVUS and gyn follow up.   AnMalachy MoodMD, FALoura PardonB/GYN, [facility] 05/29/2019, 4:17 PM

## 2019-06-11 ENCOUNTER — Ambulatory Visit (HOSPITAL_BASED_OUTPATIENT_CLINIC_OR_DEPARTMENT_OTHER): Payer: 59 | Admitting: Pharmacist

## 2019-06-11 ENCOUNTER — Other Ambulatory Visit: Payer: Self-pay

## 2019-06-11 DIAGNOSIS — Z79899 Other long term (current) drug therapy: Secondary | ICD-10-CM

## 2019-06-11 MED ORDER — DIMETHYL FUMARATE 240 MG PO CPDR: 240.0000 mg | DELAYED_RELEASE_CAPSULE | Freq: Two times a day (BID) | ORAL | 1 refills | Status: DC

## 2019-06-11 NOTE — Progress Notes (Signed)
S: Patient presents today for review of their specialty medication.   Patient is currently takingTecfidera(dimethyl fumarate) forMS. Patient is managed byDr.Potter for this.  Adherence:confirms  Efficacy:pt is happy withhisresults  Dosing:240 mg BID  Renal adjustment: no adjustment necessary  Hepatic adjustment: no adjustment necessary  Dose adjustment for toxicity: Flushing, GI intolerance, or intolerance to maintenance dose: Consider temporary dose reduction to 120 mg twice daily (resume recommended maintenance dose of 240 mg twice daily within 4 weeks). Consider discontinuation in patients who cannot tolerate return to the maintenance dose.  Hepatic injury (suspected drug-induced), clinically significant: Discontinue treatment.  Lymphocyte count <500/mm3 persisting for >6 months: Consider treatment interruption.  Serious infection: Consider withholding treatment until infection resolves.  Current adverse effects: Flushing, skin rash, or puritus: occasionally presents; pt is able to tolerate S/sx of infection:none GI upset:none S/sx ofheptotoxicity:none  O:  CMP     Component Value Date/Time   NA 138 01/16/2019 0800   NA 138 07/19/2012 1435   K 4.3 01/16/2019 0800   K 4.1 07/19/2012 1435   CL 101 01/16/2019 0800   CL 104 07/19/2012 1435   CO2 27 01/16/2019 0800   CO2 28 07/19/2012 1435   GLUCOSE 98 01/16/2019 0800   GLUCOSE 82 07/19/2012 1435   BUN 20 01/16/2019 0800   BUN 11 07/19/2012 1435   CREATININE 0.69 01/16/2019 0800   CREATININE 0.76 07/19/2012 1435   CALCIUM 9.2 01/16/2019 0800   CALCIUM 8.8 07/19/2012 1435   PROT 7.3 01/16/2019 0800   ALBUMIN 4.2 01/16/2019 0800   AST 18 01/16/2019 0800   ALT 20 01/16/2019 0800   ALKPHOS 72 01/16/2019 0800   BILITOT 0.8 01/16/2019 0800   GFRNONAA >60 01/16/2019 0800   GFRNONAA >60 07/19/2012 1435   GFRAA >60 01/16/2019 0800   GFRAA >60 07/19/2012 1435   CBC    Component Value  Date/Time   WBC 4.2 01/16/2019 0800   RBC 3.66 (L) 01/16/2019 0800   HGB 11.8 (L) 01/16/2019 0800   HGB 11.8 (L) 10/28/2013 1128   HCT 34.0 (L) 01/16/2019 0800   HCT 34.5 (L) 10/28/2013 1128   PLT 283 01/16/2019 0800   PLT 243 10/28/2013 1128   MCV 92.9 01/16/2019 0800   MCV 92 10/28/2013 1128   MCH 32.2 01/16/2019 0800   MCHC 34.7 01/16/2019 0800   RDW 11.9 01/16/2019 0800   RDW 12.2 10/28/2013 1128   LYMPHSABS 1.3 01/16/2019 0800   LYMPHSABS 2.0 07/19/2012 1435   MONOABS 0.4 01/16/2019 0800   MONOABS 0.6 07/19/2012 1435   EOSABS 0.1 01/16/2019 0800   EOSABS 0.1 07/19/2012 1435   BASOSABS 0.0 01/16/2019 0800   BASOSABS 0.0 07/19/2012 1435   A/P:  A/P: 1. Medication review: Patient is currently onTecfideraforMSand is tolerating it well. Reviewed the medication with the patient, including the following:dimethyl fumarate activates the Nrf2 pathway, which is believed to result in anti-inflammatory and cytoprotective properties.Themedication is oral and should be swallowed whole. Administering with a high-fat, high-protein meal may decrease flushing and GI side effects.Possible adverse effects include skin flushing, pruritus, GI upset, albuminura, infection, lymphocytopenia, and increased liver transaminases.Cases of PML have been reported with severe, long-standing lymphopenia identified as the primary risk for PML. Dose adjustments for toxicities have been summarized above.No recommendations for any changes at this time.  Benard Halsted, PharmD, [place] [phone]

## 2019-06-13 ENCOUNTER — Other Ambulatory Visit: Payer: Self-pay

## 2019-06-13 ENCOUNTER — Ambulatory Visit: Payer: 59 | Admitting: Obstetrics and Gynecology

## 2019-06-13 ENCOUNTER — Ambulatory Visit (INDEPENDENT_AMBULATORY_CARE_PROVIDER_SITE_OTHER): Payer: 59

## 2019-06-13 DIAGNOSIS — N950 Postmenopausal bleeding: Secondary | ICD-10-CM | POA: Diagnosis not present

## 2019-06-13 DIAGNOSIS — D251 Intramural leiomyoma of uterus: Secondary | ICD-10-CM | POA: Diagnosis not present

## 2019-06-13 MED FILL — DIMETHYL FUMARATE 240 MG CP: 240 | 30 days supply | Qty: 60 | Fill #0

## 2019-07-11 MED FILL — DIMETHYL FUMARATE 240 MG CP: 240 | 30 days supply | Qty: 60 | Fill #1

## 2019-07-15 MED FILL — DIMETHYL FUMARATE 240 MG CP: 240 | 30 days supply | Qty: 60 | Fill #1

## 2019-08-16 MED FILL — DIMETHYL FUMARATE 240 MG CP: 240 | 30 days supply | Qty: 60 | Fill #2

## 2019-09-03 DIAGNOSIS — H469 Unspecified optic neuritis: Secondary | ICD-10-CM | POA: Diagnosis not present

## 2019-09-16 MED FILL — DIMETHYL FUMARATE 240 MG CP: 240 | 30 days supply | Qty: 60 | Fill #3

## 2019-10-15 MED FILL — DIMETHYL FUMARATE 240 MG CP: 240 | 30 days supply | Qty: 60 | Fill #4

## 2019-10-23 ENCOUNTER — Other Ambulatory Visit: Payer: Self-pay | Admitting: Obstetrics and Gynecology

## 2019-10-23 DIAGNOSIS — N644 Mastodynia: Secondary | ICD-10-CM

## 2019-11-04 DIAGNOSIS — M79671 Pain in right foot: Secondary | ICD-10-CM | POA: Diagnosis not present

## 2019-11-04 DIAGNOSIS — R208 Other disturbances of skin sensation: Secondary | ICD-10-CM | POA: Diagnosis not present

## 2019-11-04 DIAGNOSIS — G35 Multiple sclerosis: Secondary | ICD-10-CM | POA: Diagnosis not present

## 2019-11-14 ENCOUNTER — Ambulatory Visit
Admission: RE | Admit: 2019-11-14 | Discharge: 2019-11-14 | Disposition: A | Discharge status: Home / Self Care | Payer: 59 | Source: Ambulatory Visit | Attending: Obstetrics and Gynecology | Admitting: Obstetrics and Gynecology

## 2019-11-14 DIAGNOSIS — N644 Mastodynia: Secondary | ICD-10-CM

## 2019-11-14 DIAGNOSIS — N6489 Other specified disorders of breast: Secondary | ICD-10-CM | POA: Diagnosis not present

## 2019-11-14 DIAGNOSIS — R922 Inconclusive mammogram: Secondary | ICD-10-CM | POA: Diagnosis not present

## 2019-11-14 MED FILL — DIMETHYL FUMARATE 240 MG CP: 240 | 30 days supply | Qty: 60 | Fill #5

## 2019-12-10 ENCOUNTER — Ambulatory Visit
Admission: RE | Admit: 2019-12-10 | Discharge: 2019-12-10 | Disposition: A | Discharge status: Home / Self Care | Payer: 59 | Source: Ambulatory Visit | Attending: Emergency Medicine | Admitting: Emergency Medicine

## 2019-12-10 ENCOUNTER — Other Ambulatory Visit: Payer: Self-pay

## 2019-12-10 VITALS — BP 113/79 | HR 87 | Temp 98.8°F | Resp 22

## 2019-12-10 DIAGNOSIS — H6121 Impacted cerumen, right ear: Secondary | ICD-10-CM

## 2019-12-10 DIAGNOSIS — R42 Dizziness and giddiness: Secondary | ICD-10-CM

## 2019-12-10 DIAGNOSIS — H9201 Otalgia, right ear: Secondary | ICD-10-CM

## 2019-12-10 MED ORDER — AMOXICILLIN 875 MG PO TABS: 875.0000 mg | ORAL_TABLET | Freq: Two times a day (BID) | ORAL | 0 refills | Status: AC

## 2019-12-10 NOTE — Discharge Instructions (Signed)
Take the amoxicillin as directed.    Follow up with your primary care provider in 1 week for a recheck.    Change positions slowly and make sure you are steady as you stand or walk.  Keep yourself hydrated.

## 2019-12-10 NOTE — ED Provider Notes (Signed)
Pam Hamilton    CSN: [id number] Arrival date & time: 12/10/19  1447      History   Chief Complaint Chief Complaint  Patient presents with  . Otalgia    HPI Pam Hamilton is a 54 y.o. female.   Patient presents with right ear pain intermittently x1 month.  She states the pain returned this morning; it is sharp and shooting.  She also reports mild intermittent dizziness today.  Treatment attempted at home with Claritin and hydrogen peroxide eardrops.  Patient denies fever, chills, sore throat, cough, shortness of breath, vomiting, diarrhea, rash, or other symptoms.  The history is provided by the patient.    Past Medical History:  Diagnosis Date  . Depression    1997  . Endometrial polyp   . Family history of breast cancer 03/2018   cancer genetic testing letter sent  . Family history of ovarian cancer   . Multiple sclerosis ([place])   . Shingles    right upper arm ? year 2015 or prior   . Urine incontinence 2015   2 Episodes     Patient Active Problem List   Diagnosis Date Noted  . Prediabetes 01/16/2019  . Anxiety 01/10/2019  . Multiple sclerosis ([place]) 11/02/2017  . Anemia 11/02/2017  . Abnormal bleeding in menstrual cycle 01/17/2017  . Menopausal hot flushes 01/17/2017  . Annual physical exam   . First degree hemorrhoids   . Diverticulosis of large intestine without diverticulitis   . GERD (gastroesophageal reflux disease) 09/26/2016  . Vitamin B12 deficiency 08/17/2016  . Fracture of multiple ribs 08/24/2015  . Chronic pain in right foot 03/10/2015  . Encounter to establish care 06/23/2014  . Routine general medical examination at a health care facility 06/23/2014  . Numbness and tingling of right arm 01/06/2014    Past Surgical History:  Procedure Laterality Date  . BREAST CYST ASPIRATION     neg  . CESAREAN SECTION    . COLONOSCOPY WITH PROPOFOL N/A 11/18/2016   Procedure: COLONOSCOPY WITH PROPOFOL;  Surgeon: Jonathon Bellows, MD;  Location: [facility]  ENDOSCOPY;  Service: Endoscopy;  Laterality: N/A;  . INTRAUTERINE DEVICE (IUD) INSERTION  01/02/2014   Mirena  . OVARY SURGERY  6/252015   Right ovary removed.  Benign cyst.  . POLYPECTOMY  2008,11/07/13   Removal of polyps from uterine. Vernie Ammons)  . SALPINGECTOMY  11/07/2013   Right.Marland KitchenMarland KitchenHemorrhagic cyst  . Sonohysterogram  05/07/2007   VanDalen    OB History    Gravida  2   Para  2   Term  2   Preterm      AB      Living  2     SAB      TAB      Ectopic      Multiple      Live Births               Home Medications    Prior to Admission medications   Medication Sig Start Date End Date Taking? Authorizing Provider  amoxicillin (AMOXIL) 875 MG tablet Take 1 tablet (875 mg total) by mouth 2 (two) times daily for 7 days. 12/10/19 12/17/19  Sharion Balloon, NP  Cholecalciferol (VITAMIN D) 2000 units CAPS Take 2,000 Units by mouth daily.     [provider]  Dimethyl Fumarate 240 MG CPDR Take 1 capsule (240 mg total) by mouth 2 (two) times daily. 06/11/19   Tresa Garter, MD  gabapentin (NEURONTIN) 100 MG  capsule Take 1 capsule (100 mg total) by mouth at bedtime. 05/22/17   Malachy Mood, MD    Family History Family History  Problem Relation Age of Onset  . Hyperlipidemia Mother   . Hypertension Mother   . Diabetes Mother   . Alcohol abuse Father   . Anuerysm Paternal Grandmother   . Breast cancer Maternal Aunt 56  . Breast cancer Cousin 30  . Ovarian cancer Other 6    Social History Social History   Tobacco Use  . Smoking status: Never Smoker  . Smokeless tobacco: Never Used  Vaping Use  . Vaping Use: Never used  Substance Use Topics  . Alcohol use: Yes    Alcohol/week: 1.0 standard drink    Types: 1 Glasses of wine per week    Comment: Once a month   . Drug use: No     Allergies   Ciprofloxacin and Septra [sulfamethoxazole-trimethoprim]   Review of Systems Review of Systems  Constitutional: Negative for chills and fever.   HENT: Positive for ear pain. Negative for sore throat.   Eyes: Negative for pain and visual disturbance.  Respiratory: Negative for cough and shortness of breath.   Cardiovascular: Negative for chest pain and palpitations.  Gastrointestinal: Negative for abdominal pain and vomiting.  Genitourinary: Negative for dysuria and hematuria.  Musculoskeletal: Negative for arthralgias and back pain.  Skin: Negative for color change and rash.  Neurological: Positive for dizziness. Negative for seizures, syncope, facial asymmetry, speech difficulty, weakness and numbness.  All other systems reviewed and are negative.    Physical Exam Triage Vital Signs ED Triage Vitals  Enc Vitals Group     BP 12/10/19 1453 109/73     Pulse Rate 12/10/19 1453 76     Resp 12/10/19 1453 16     Temp 12/10/19 1453 98.8 F (37.1 C)     Temp src --      SpO2 12/10/19 1453 97 %     Weight --      Height --      Head Circumference --      Peak Flow --      Pain Score 12/10/19 1447 5     Pain Loc --      Pain Edu? --      Excl. in [place]? --    No data found.  Updated Vital Signs BP 113/79   Pulse 87   Temp 98.8 F (37.1 C)   Resp 22   LMP 01/26/2018   SpO2 97%   Visual Acuity Right Eye Distance:   Left Eye Distance:   Bilateral Distance:    Right Eye Near:   Left Eye Near:    Bilateral Near:     Physical Exam Vitals and nursing note reviewed.  Constitutional:      General: She is not in acute distress.    Appearance: She is well-developed.  HENT:     Head: Normocephalic and atraumatic.     Right Ear: There is impacted cerumen.     Left Ear: Tympanic membrane and ear canal normal. There is no impacted cerumen.     Ears:     Comments: Unable to visualize right TM due to cerumen.    Nose: Nose normal.     Mouth/Throat:     Mouth: Mucous membranes are moist.     Pharynx: Oropharynx is clear.  Eyes:     Conjunctiva/sclera: Conjunctivae normal.  Cardiovascular:     Rate and Rhythm: Normal  rate  and regular rhythm.     Heart sounds: No murmur heard.   Pulmonary:     Effort: Pulmonary effort is normal. No respiratory distress.     Breath sounds: Normal breath sounds.  Abdominal:     Palpations: Abdomen is soft.     Tenderness: There is no abdominal tenderness. There is no guarding or rebound.  Musculoskeletal:     Cervical back: Neck supple.  Skin:    General: Skin is warm and dry.     Findings: No rash.  Neurological:     General: No focal deficit present.     Mental Status: She is alert and oriented to person, place, and time.     Gait: Gait normal.  Psychiatric:        Mood and Affect: Mood normal.        Behavior: Behavior normal.      UC Treatments / Results  Labs (all labs ordered are listed, but only abnormal results are displayed) Labs Reviewed - No data to display  EKG   Radiology No results found.  Procedures Procedures (including critical care time)  Medications Ordered in UC Medications - No data to display  Initial Impression / Assessment and Plan / UC Course  I have reviewed the triage vital signs and the nursing notes.  Pertinent labs & imaging results that were available during my care of the patient were reviewed by me and considered in my medical decision making (see chart for details).   Impacted cerumen and otalgia of the right ear.  Dizziness.  Patient was unable to tolerate removal of her earwax due to dizziness.  She recovered quickly once the earwax removal was discontinued.  I am unable to visualize her right TM.  Treating proactively with 7-day course of amoxicillin.  Instructed patient to follow-up with her PCP in 1 week for a recheck of her ear and possible removal of the earwax at that time.  Safety precautions for dizziness discussed with patient at length.  Patient agrees to plan of care.   Final Clinical Impressions(s) / UC Diagnoses   Final diagnoses:  Impacted cerumen of right ear  Acute otalgia, right  Dizziness      Discharge Instructions     Take the amoxicillin as directed.    Follow up with your primary care provider in 1 week for a recheck.    Change positions slowly and make sure you are steady as you stand or walk.  Keep yourself hydrated.          ED Prescriptions    Medication Sig Dispense Auth. Provider   amoxicillin (AMOXIL) 875 MG tablet Take 1 tablet (875 mg total) by mouth 2 (two) times daily for 7 days. 14 tablet Sharion Balloon, NP     PDMP not reviewed this encounter.   Sharion Balloon, NP 12/10/19 1540

## 2019-12-10 NOTE — ED Triage Notes (Signed)
Patient states about 1 month ago she noticed ear fullness and treated it with claritin and hydrogen peroxide drops. States it went away but has come back today. States it is a sharp shooting pain that shoots into her neck. Also reports some intermittent dizziness. Reports she took tylenol this morning.

## 2019-12-12 ENCOUNTER — Other Ambulatory Visit (HOSPITAL_COMMUNITY): Payer: Self-pay | Admitting: Internal Medicine

## 2019-12-12 ENCOUNTER — Other Ambulatory Visit: Payer: Self-pay | Admitting: Pharmacist

## 2019-12-12 MED ORDER — DIMETHYL FUMARATE 240 MG PO CPDR: 240.0000 mg | DELAYED_RELEASE_CAPSULE | Freq: Two times a day (BID) | ORAL | 1 refills | Status: DC

## 2019-12-12 MED FILL — DIMETHYL FUMARATE 240 MG CP: 240 | 30 days supply | Qty: 60 | Fill #0

## 2020-01-13 MED FILL — DIMETHYL FUMARATE 240 MG CP: 240 | 30 days supply | Qty: 60 | Fill #1

## 2020-01-14 ENCOUNTER — Encounter: Payer: 59 | Admitting: Internal Medicine

## 2020-01-21 ENCOUNTER — Encounter: Payer: 59 | Admitting: Internal Medicine

## 2020-02-06 ENCOUNTER — Encounter: Payer: Self-pay | Admitting: Internal Medicine

## 2020-02-06 ENCOUNTER — Ambulatory Visit (INDEPENDENT_AMBULATORY_CARE_PROVIDER_SITE_OTHER): Payer: 59 | Admitting: Internal Medicine

## 2020-02-06 ENCOUNTER — Other Ambulatory Visit: Payer: Self-pay

## 2020-02-06 VITALS — BP 130/80 | HR 89 | Temp 98.4°F | Ht 66.5 in | Wt 155.4 lb

## 2020-02-06 DIAGNOSIS — R079 Chest pain, unspecified: Secondary | ICD-10-CM | POA: Diagnosis not present

## 2020-02-06 DIAGNOSIS — G35 Multiple sclerosis: Secondary | ICD-10-CM

## 2020-02-06 DIAGNOSIS — Z23 Encounter for immunization: Secondary | ICD-10-CM | POA: Diagnosis not present

## 2020-02-06 DIAGNOSIS — F439 Reaction to severe stress, unspecified: Secondary | ICD-10-CM | POA: Diagnosis not present

## 2020-02-06 DIAGNOSIS — Z1329 Encounter for screening for other suspected endocrine disorder: Secondary | ICD-10-CM | POA: Diagnosis not present

## 2020-02-06 DIAGNOSIS — Z13220 Encounter for screening for lipoid disorders: Secondary | ICD-10-CM

## 2020-02-06 DIAGNOSIS — K219 Gastro-esophageal reflux disease without esophagitis: Secondary | ICD-10-CM

## 2020-02-06 DIAGNOSIS — Z0000 Encounter for general adult medical examination without abnormal findings: Secondary | ICD-10-CM

## 2020-02-06 DIAGNOSIS — Z1389 Encounter for screening for other disorder: Secondary | ICD-10-CM

## 2020-02-06 DIAGNOSIS — Z566 Other physical and mental strain related to work: Secondary | ICD-10-CM

## 2020-02-06 MED FILL — DIMETHYL FUMARATE 240 MG CP: 240 | 30 days supply | Qty: 60 | Fill #2

## 2020-02-06 NOTE — Progress Notes (Addendum)
Chief Complaint  Patient presents with  . Annual Exam   Annual  1. Ms stable on Tecfidera 240 mg bid f/u D.r Melrose Nakayama 08/03/20 2. Agreeable to Tdap today  3. C/o chest pain last night had to take tums and sit up in the bed which helped but chest pain has been chronic since before this incidence. She is having stressors due to 53 y.o son in [place] but just got a puppy to try to help reduce her stress level and declines medications for now    Review of Systems  Constitutional: Negative for weight loss.  HENT: Negative for hearing loss.   Eyes: Negative for blurred vision.  Respiratory: Negative for shortness of breath.   Cardiovascular: Negative for chest pain.  Gastrointestinal: Positive for heartburn.  Musculoskeletal: Negative for falls and joint pain.  Skin: Negative for rash.  Neurological: Negative for weakness and headaches.  Psychiatric/Behavioral:       +stress    Past Medical History:  Diagnosis Date  . Depression    1997  . Endometrial polyp   . Family history of breast cancer 03/2018   cancer genetic testing letter sent  . Family history of ovarian cancer   . Multiple sclerosis ([place])   . Shingles    right upper arm ? year 2015 or prior   . Urine incontinence 2015   2 Episodes    Past Surgical History:  Procedure Laterality Date  . BREAST CYST ASPIRATION     neg  . CESAREAN SECTION    . COLONOSCOPY WITH PROPOFOL N/A 11/18/2016   Procedure: COLONOSCOPY WITH PROPOFOL;  Surgeon: Jonathon Bellows, MD;  [facility] ENDOSCOPY;  Service: Endoscopy;  Laterality: N/A;  . INTRAUTERINE DEVICE (IUD) INSERTION  01/02/2014   Mirena  . OVARY SURGERY  6/252015   Right ovary removed.  Benign cyst.  . POLYPECTOMY  2008,11/07/13   Removal of polyps from uterine. Vernie Ammons)  . SALPINGECTOMY  11/07/2013   Right.Marland KitchenMarland KitchenHemorrhagic cyst  . Sonohysterogram  05/07/2007   VanDalen   Family History  Problem Relation Age of Onset  . Hyperlipidemia Mother   . Hypertension Mother   .  Diabetes Mother   . Osteoarthritis Mother   . Hypothyroidism Mother   . Alcohol abuse Father   . Anuerysm Paternal Grandmother   . Breast cancer Maternal Aunt 27  . Breast cancer Cousin 30  . Ovarian cancer Other 68   Social History   Socioeconomic History  . Marital status: Married    Spouse name: Not on file  . Number of children: Not on file  . Years of education: Not on file  . Highest education level: Not on file  Occupational History  . Not on file  Tobacco Use  . Smoking status: Never Smoker  . Smokeless tobacco: Never Used  Vaping Use  . Vaping Use: Never used  Substance and Sexual Activity  . Alcohol use: Yes    Alcohol/week: 1.0 standard drink    Types: 1 Glasses of wine per week    Comment: Once a month   . Drug use: No  . Sexual activity: Yes    Birth control/protection: None  Other Topics Concern  . Not on file  Social History Narrative   Lives in [place] with Husband   2 children 58 and 17 both boys   1 Cat and 1 Rabbit   [employer]   Enjoys attending church, being with her boys       Social Determinants of  Health   Financial Resource Strain:   . Difficulty of Paying Living Expenses: Not on file  Food Insecurity:   . Worried About Charity fundraiser in the Last Year: Not on file  . Ran Out of Food in the Last Year: Not on file  Transportation Needs:   . Lack of Transportation (Medical): Not on file  . Lack of Transportation (Non-Medical): Not on file  Physical Activity:   . Days of Exercise per Week: Not on file  . Minutes of Exercise per Session: Not on file  Stress:   . Feeling of Stress : Not on file  Social Connections:   . Frequency of Communication with Friends and Family: Not on file  . Frequency of Social Gatherings with Friends and Family: Not on file  . Attends Religious Services: Not on file  . Active Member of Clubs or Organizations: Not on file  . Attends Archivist Meetings: Not on file  .  Marital Status: Not on file  Intimate Partner Violence:   . Fear of Current or Ex-Partner: Not on file  . Emotionally Abused: Not on file  . Physically Abused: Not on file  . Sexually Abused: Not on file   Current Meds  Medication Sig  . Cholecalciferol (VITAMIN D) 2000 units CAPS Take 2,000 Units by mouth daily.   . Dimethyl Fumarate 240 MG CPDR Take 1 capsule (240 mg total) by mouth 2 (two) times daily. (Patient taking differently: Take 240 mg by mouth 2 (two) times daily. [place] for MS)  . gabapentin (NEURONTIN) 100 MG capsule Take 1 capsule (100 mg total) by mouth at bedtime.   Allergies  Allergen Reactions  . Ciprofloxacin Swelling    Cipro  . Septra [Sulfamethoxazole-Trimethoprim] Swelling   Recent Results (from the past 2160 hour(s))  T4, free     Status: None   Collection Time: 02/11/20  7:48 AM  Result Value Ref Range   Free T4 0.73 0.61 - 1.12 ng/dL    Comment: (NOTE) Biotin ingestion may interfere with free T4 tests. If the results are inconsistent with the TSH level, previous test results, or the clinical presentation, then consider biotin interference. If needed, order repeat testing after stopping biotin. Performed at [facility], [address] [place], [place] [address]   Urinalysis, Routine w reflex microscopic     Status: Abnormal   Collection Time: 02/11/20  7:48 AM  Result Value Ref Range   Color, Urine YELLOW (A) YELLOW   APPearance HAZY (A) CLEAR   Specific Gravity, Urine 1.008 1.005 - 1.030   pH 7.0 5.0 - 8.0   Glucose, UA NEGATIVE NEGATIVE mg/dL   Hgb urine dipstick NEGATIVE NEGATIVE   Bilirubin Urine NEGATIVE NEGATIVE   Ketones, ur NEGATIVE NEGATIVE mg/dL   Protein, ur NEGATIVE NEGATIVE mg/dL   Nitrite NEGATIVE NEGATIVE   Leukocytes,Ua SMALL (A) NEGATIVE   RBC / HPF 0-5 0 - 5 RBC/hpf   WBC, UA 0-5 0 - 5 WBC/hpf   Bacteria, UA NONE SEEN NONE SEEN   Squamous Epithelial / LPF 6-10 0 - 5    Comment: Performed at [facility], [address], [place], [place] [address]  CBC with Differential/Platelet     Status: Abnormal   Collection Time: 02/11/20  7:48 AM  Result Value Ref Range   WBC 4.3 4.0 - 10.5 K/uL   RBC 3.61 (L) 3.87 - 5.11 MIL/uL   Hemoglobin 11.6 (L) 12.0 - 15.0 g/dL   HCT  33.1 (L) 36 - 46 %   MCV 91.7 80.0 - 100.0 fL   MCH 32.1 26.0 - 34.0 pg   MCHC 35.0 30.0 - 36.0 g/dL   RDW 11.9 11.5 - 15.5 %   Platelets 239 150 - 400 K/uL   nRBC 0.0 0.0 - 0.2 %   Neutrophils Relative % 56 %   Neutro Abs 2.4 1.7 - 7.7 K/uL   Lymphocytes Relative 29 %   Lymphs Abs 1.2 0.7 - 4.0 K/uL   Monocytes Relative 11 %   Monocytes Absolute 0.5 0.1 - 1.0 K/uL   Eosinophils Relative 3 %   Eosinophils Absolute 0.1 0.0 - 0.5 K/uL   Basophils Relative 1 %   Basophils Absolute 0.0 0.0 - 0.1 K/uL   Immature Granulocytes 0 %   Abs Immature Granulocytes 0.01 0.00 - 0.07 K/uL    Comment: Performed at [facility], [address] [place], [place] [address]  TSH     Status: None   Collection Time: 02/11/20  7:48 AM  Result Value Ref Range   TSH 4.484 0.350 - 4.500 uIU/mL    Comment: Performed by a 3rd Generation assay with a functional sensitivity of <=0.01 uIU/mL. Performed at [facility], [address] [place], [place] [address]   Lipid panel     Status: None   Collection Time: 02/11/20  7:48 AM  Result Value Ref Range   Cholesterol 149 0 - 200 mg/dL   Triglycerides 40 <150 mg/dL   HDL 83 >40 mg/dL   Total CHOL/HDL Ratio 1.8 RATIO   VLDL 8 0 - 40 mg/dL   LDL Cholesterol 58 0 - 99 mg/dL    Comment:        Total Cholesterol/HDL:CHD Risk Coronary Heart Disease Risk Table                     Men   Women  1/2 Average Risk   3.4   3.3  Average Risk       5.0   4.4  2 X Average Risk   9.6   7.1  3 X Average Risk  23.4   11.0        Use the calculated Patient Ratio above and the CHD Risk Table to determine the patient's CHD Risk.        ATP III CLASSIFICATION (LDL):  <100     mg/dL    Optimal  100-129  mg/dL   Near or Above                    Optimal  130-159  mg/dL   Borderline  160-189  mg/dL   High  >190     mg/dL   Very High Performed at [facility], [address] [place], [place] [address]   Comprehensive metabolic panel     Status: None   Collection Time: 02/11/20  7:48 AM  Result Value Ref Range   Sodium 138 135 - 145 mmol/L   Potassium 4.1 3.5 - 5.1 mmol/L   Chloride 103 98 - 111 mmol/L   CO2 26 22 - 32 mmol/L   Glucose, Bld 98 70 - 99 mg/dL    Comment: Glucose reference range applies only to samples taken after fasting for at least 8 hours.   BUN 19 6 - 20 mg/dL   Creatinine, Ser 0.68 0.44 - 1.00 mg/dL   Calcium 9.1 8.9 - 10.3 mg/dL   Total Protein 7.5  6.5 - 8.1 g/dL   Albumin 4.4 3.5 - 5.0 g/dL   AST 17 15 - 41 U/L   ALT 18 0 - 44 U/L   Alkaline Phosphatase 65 38 - 126 U/L   Total Bilirubin 1.1 0.3 - 1.2 mg/dL   GFR calc non Af Amer >60 >60 mL/min   GFR calc Af Amer >60 >60 mL/min   Anion gap 9 5 - 15    Comment: Performed at [facility], [address] [place], [place] [address]   Objective  Body mass index is 25.08 kg/m. Wt Readings from Last 3 Encounters:  02/06/20 155 lb 6.4 oz (70.5 kg)  05/29/19 150 lb (68 kg)  01/10/19 148 lb 3.2 oz (67.2 kg)   Temp Readings from Last 3 Encounters:  02/06/20 98.4 F (36.9 C) (Oral)  12/10/19 98.8 F (37.1 C)  01/10/19 98.6 F (37 C)   BP Readings from Last 3 Encounters:  02/06/20 130/80  12/10/19 113/79  05/29/19 133/71   Pulse Readings from Last 3 Encounters:  02/06/20 89  12/10/19 87  05/29/19 97    Physical Exam Constitutional:      Appearance: Normal appearance. She is well-developed and well-groomed.  Cardiovascular:     Rate and Rhythm: Normal rate and regular rhythm.     Heart sounds: Normal heart sounds.  Pulmonary:     Effort: Pulmonary effort is normal.     Breath sounds: Normal breath sounds.  Skin:    General: Skin is warm and moist.   Neurological:     General: No focal deficit present.     Mental Status: She is alert and oriented to person, place, and time.     Motor: Motor function is intact.     Coordination: Coordination is intact.     Gait: Gait is intact.  Psychiatric:        Attention and Perception: Attention and perception normal.        Mood and Affect: Mood and affect normal.        Speech: Speech normal.        Behavior: Behavior normal. Behavior is cooperative.        Thought Content: Thought content normal.        Cognition and Memory: Cognition and memory normal.        Judgment: Judgment normal.     Assessment  Plan  Annual physical exam -  Check labs with cone fasting  Had flu shot 02/2020 will get at work  Tdap had 09/08/09 given [facility] 2/2  Disc shingrix again  - and given info pt wants to think about h/o shingles in past disc DDI with MS medication will check with Dr. Melrose Nakayama   Per pt MMR titer checked in the past and immune.   Pap and genetic testing need to get records [facility] 03/15/18 pap negative   mammo neg 10/11/18 negative 11/14/19 negative   Colonoscopy 11/18/16 diverticulosis/IH f/u due in 10 years  No need for dermatology referral at this time   Eye MD contacts wears glasses w/in 6 months of 02/06/20   Chest pain, unspecified type - Plan: Ambulatory referral to Cardiology, Ambulatory referral to Gastroenterology  Stress at home Stress at work  Multiple sclerosis [facility]) - Plan: CBC with Differential/Platelet  Gastroesophageal reflux disease without esophagitis - Plan: Ambulatory referral to Gastroenterology    Provider: Dr. Olivia Mackie McLean-Scocuzza-Internal Medicine

## 2020-02-06 NOTE — Patient Instructions (Addendum)
Consider shingrix vaccine as well here x 2 doses   Osteoarthritis  Osteoarthritis is a type of arthritis that affects tissue that covers the ends of bones in joints (cartilage). Cartilage acts as a cushion between the bones and helps them move smoothly. Osteoarthritis results when cartilage in the joints gets worn down. Osteoarthritis is sometimes called "wear and tear" arthritis. Osteoarthritis is the most common form of arthritis. It often occurs in older people. It is a condition that gets worse over time (a progressive condition). Joints that are most often affected by this condition are in:  Fingers.  Toes.  Hips.  Knees.  Spine, including neck and lower back. What are the causes? This condition is caused by age-related wearing down of cartilage that covers the ends of bones. What increases the risk? The following factors may make you more likely to develop this condition:  Older age.  Being overweight or obese.  Overuse of joints, such as in athletes.  Past injury of a joint.  Past surgery on a joint.  Family history of osteoarthritis. What are the signs or symptoms? The main symptoms of this condition are pain, swelling, and stiffness in the joint. The joint may lose its shape over time. Small pieces of bone or cartilage may break off and float inside of the joint, which may cause more pain and damage to the joint. Small deposits of bone (osteophytes) may grow on the edges of the joint. Other symptoms may include:  A grating or scraping feeling inside the joint when you move it.  Popping or creaking sounds when you move. Symptoms may affect one or more joints. Osteoarthritis in a major joint, such as your knee or hip, can make it painful to walk or exercise. If you have osteoarthritis in your hands, you might not be able to grip items, twist your hand, or control small movements of your hands and fingers (fine motor skills). How is this diagnosed? This condition may be  diagnosed based on:  Your medical history.  A physical exam.  Your symptoms.  X-rays of the affected joint(s).  Blood tests to rule out other types of arthritis. How is this treated? There is no cure for this condition, but treatment can help to control pain and improve joint function. Treatment plans may include:  A prescribed exercise program that allows for rest and joint relief. You may work with a physical therapist.  A weight control plan.  Pain relief techniques, such as: ? Applying heat and cold to the joint. ? Electric pulses delivered to nerve endings under the skin (transcutaneous electrical nerve stimulation, or TENS). ? Massage. ? Certain nutritional supplements.  NSAIDs or prescription medicines to help relieve pain.  Medicine to help relieve pain and inflammation (corticosteroids). This can be given by mouth (orally) or as an injection.  Assistive devices, such as a brace, wrap, splint, specialized glove, or cane.  Surgery, such as: ? An osteotomy. This is done to reposition the bones and relieve pain or to remove loose pieces of bone and cartilage. ? Joint replacement surgery. You may need this surgery if you have very bad (advanced) osteoarthritis. Follow these instructions at home: Activity  Rest your affected joints as directed by your health care provider.  Do not drive or use heavy machinery while taking prescription pain medicine.  Exercise as directed. Your health care provider or physical therapist may recommend specific types of exercise, such as: ? Strengthening exercises. These are done to strengthen the muscles that  support joints that are affected by arthritis. They can be performed with weights or with exercise bands to add resistance. ? Aerobic activities. These are exercises, such as brisk walking or water aerobics, that get your heart pumping. ? Range-of-motion activities. These keep your joints easy to move. ? Balance and agility  exercises. Managing pain, stiffness, and swelling      If directed, apply heat to the affected area as often as told by your health care provider. Use the heat source that your health care provider recommends, such as a moist heat pack or a heating pad. ? If you have a removable assistive device, remove it as told by your health care provider. ? Place a towel between your skin and the heat source. If your health care provider tells you to keep the assistive device on while you apply heat, place a towel between the assistive device and the heat source. ? Leave the heat on for 20-30 minutes. ? Remove the heat if your skin turns bright red. This is especially important if you are unable to feel pain, heat, or cold. You may have a greater risk of getting burned.  If directed, put ice on the affected joint: ? If you have a removable assistive device, remove it as told by your health care provider. ? Put ice in a plastic bag. ? Place a towel between your skin and the bag. If your health care provider tells you to keep the assistive device on during icing, place a towel between the assistive device and the bag. ? Leave the ice on for 20 minutes, 2-3 times a day. General instructions  Take over-the-counter and prescription medicines only as told by your health care provider.  Maintain a healthy weight. Follow instructions from your health care provider for weight control. These may include dietary restrictions.  Do not use any products that contain nicotine or tobacco, such as cigarettes and e-cigarettes. These can delay bone healing. If you need help quitting, ask your health care provider.  Use assistive devices as directed by your health care provider.  Keep all follow-up visits as told by your health care provider. This is important. Where to find more information  [employer] of Arthritis and Musculoskeletal and Skin Diseases: www.niams.SouthEx[employer].es  [employer] on Aging:  http://kim-miller.com/  American College of Rheumatology: www.rheumatology.org Contact a health care provider if:  Your skin turns red.  You develop a rash.  You have pain that gets worse.  You have a fever along with joint or muscle aches. Get help right away if:  You lose a lot of weight.  You suddenly lose your appetite.  You have night sweats. Summary  Osteoarthritis is a type of arthritis that affects tissue covering the ends of bones in joints (cartilage).  This condition is caused by age-related wearing down of cartilage that covers the ends of bones.  The main symptom of this condition is pain, swelling, and stiffness in the joint.  There is no cure for this condition, but treatment can help to control pain and improve joint function. This information is not intended to replace advice given to you by your health care provider. Make sure you discuss any questions you have with your health care provider. Document Revised: 04/14/2017 Document Reviewed: 01/04/2016 Elsevier Patient Education  [place].  Zoster Vaccine, Recombinant injection What is this medicine? ZOSTER VACCINE (ZOS ter vak SEEN) is used to prevent shingles in adults 53 years old and over. This vaccine is not  used to treat shingles or nerve pain from shingles. This medicine may be used for other purposes; ask your health care provider or pharmacist if you have questions. COMMON BRAND NAME(S): [facility] What should I tell my health care provider before I take this medicine? They need to know if you have any of these conditions:  blood disorders or disease  cancer like leukemia or lymphoma  immune system problems or therapy  an unusual or allergic reaction to vaccines, other medications, foods, dyes, or preservatives  pregnant or trying to get pregnant  breast-feeding How should I use this medicine? This vaccine is for injection in a muscle. It is given by a health care professional. Talk to  your pediatrician regarding the use of this medicine in children. This medicine is not approved for use in children. Overdosage: If you think you have taken too much of this medicine contact a poison control center or emergency room at once. NOTE: This medicine is only for you. Do not share this medicine with others. What if I miss a dose? Keep appointments for follow-up (booster) doses as directed. It is important not to miss your dose. Call your doctor or health care professional if you are unable to keep an appointment. What may interact with this medicine?  medicines that suppress your immune system  medicines to treat cancer  steroid medicines like prednisone or cortisone This list may not describe all possible interactions. Give your health care provider a list of all the medicines, herbs, non-prescription drugs, or dietary supplements you use. Also tell them if you smoke, drink alcohol, or use illegal drugs. Some items may interact with your medicine. What should I watch for while using this medicine? Visit your doctor for regular check ups. This vaccine, like all vaccines, may not fully protect everyone. What side effects may I notice from receiving this medicine? Side effects that you should report to your doctor or health care professional as soon as possible:  allergic reactions like skin rash, itching or hives, swelling of the face, lips, or tongue  breathing problems Side effects that usually do not require medical attention (report these to your doctor or health care professional if they continue or are bothersome):  chills  headache  fever  nausea, vomiting  redness, warmth, pain, swelling or itching at site where injected  tiredness This list may not describe all possible side effects. Call your doctor for medical advice about side effects. You may report side effects to FDA at 1-800-FDA-1088. Where should I keep my medicine? This vaccine is only given in a  clinic, pharmacy, doctor's office, or other health care setting and will not be stored at home. NOTE: This sheet is a summary. It may not cover all possible information. If you have questions about this medicine, talk to your doctor, pharmacist, or health care provider.  2020 Elsevier/Gold Standard (2016-12-12 13:20:30)    Gastroesophageal Reflux Disease, Adult Gastroesophageal reflux (GER) happens when acid from the stomach flows up into the tube that connects the mouth and the stomach (esophagus). Normally, food travels down the esophagus and stays in the stomach to be digested. However, when a person has GER, food and stomach acid sometimes move back up into the esophagus. If this becomes a more serious problem, the person may be diagnosed with a disease called gastroesophageal reflux disease (GERD). GERD occurs when the reflux:  Happens often.  Causes frequent or severe symptoms.  Causes problems such as damage to the esophagus. When stomach acid  comes in contact with the esophagus, the acid may cause soreness (inflammation) in the esophagus. Over time, GERD may create small holes (ulcers) in the lining of the esophagus. What are the causes? This condition is caused by a problem with the muscle between the esophagus and the stomach (lower esophageal sphincter, or LES). Normally, the LES muscle closes after food passes through the esophagus to the stomach. When the LES is weakened or abnormal, it does not close properly, and that allows food and stomach acid to go back up into the esophagus. The LES can be weakened by certain dietary substances, medicines, and medical conditions, including:  Tobacco use.  Pregnancy.  Having a hiatal hernia.  Alcohol use.  Certain foods and beverages, such as coffee, chocolate, onions, and peppermint. What increases the risk? You are more likely to develop this condition if you:  Have an increased body weight.  Have a connective tissue  disorder.  Use NSAID medicines. What are the signs or symptoms? Symptoms of this condition include:  Heartburn.  Difficult or painful swallowing.  The feeling of having a lump in the throat.  Abitter taste in the mouth.  Bad breath.  Having a large amount of saliva.  Having an upset or bloated stomach.  Belching.  Chest pain. Different conditions can cause chest pain. Make sure you see your health care provider if you experience chest pain.  Shortness of breath or wheezing.  Ongoing (chronic) cough or a night-time cough.  Wearing away of tooth enamel.  Weight loss. How is this diagnosed? Your health care provider will take a medical history and perform a physical exam. To determine if you have mild or severe GERD, your health care provider may also monitor how you respond to treatment. You may also have tests, including:  A test to examine your stomach and esophagus with a small camera (endoscopy).  A test thatmeasures the acidity level in your esophagus.  A test thatmeasures how much pressure is on your esophagus.  A barium swallow or modified barium swallow test to show the shape, size, and functioning of your esophagus. How is this treated? The goal of treatment is to help relieve your symptoms and to prevent complications. Treatment for this condition may vary depending on how severe your symptoms are. Your health care provider may recommend:  Changes to your diet.  Medicine.  Surgery. Follow these instructions at home: Eating and drinking   Follow a diet as recommended by your health care provider. This may involve avoiding foods and drinks such as: ? Coffee and tea (with or without caffeine). ? Drinks that containalcohol. ? Energy drinks and sports drinks. ? Carbonated drinks or sodas. ? Chocolate and cocoa. ? Peppermint and mint flavorings. ? Garlic and onions. ? Horseradish. ? Spicy and acidic foods, including peppers, chili powder, curry  powder, vinegar, hot sauces, and barbecue sauce. ? Citrus fruit juices and citrus fruits, such as oranges, lemons, and limes. ? Tomato-based foods, such as red sauce, chili, salsa, and pizza with red sauce. ? Fried and fatty foods, such as donuts, french fries, potato chips, and high-fat dressings. ? High-fat meats, such as hot dogs and fatty cuts of red and white meats, such as rib eye steak, sausage, ham, and bacon. ? High-fat dairy items, such as whole milk, butter, and cream cheese.  Eat small, frequent meals instead of large meals.  Avoid drinking large amounts of liquid with your meals.  Avoid eating meals during the 2-3 hours before bedtime.  Avoid lying down right after you eat.  Do not exercise right after you eat. Lifestyle   Do not use any products that contain nicotine or tobacco, such as cigarettes, e-cigarettes, and chewing tobacco. If you need help quitting, ask your health care provider.  Try to reduce your stress by using methods such as yoga or meditation. If you need help reducing stress, ask your health care provider.  If you are overweight, reduce your weight to an amount that is healthy for you. Ask your health care provider for guidance about a safe weight loss goal. General instructions  Pay attention to any changes in your symptoms.  Take over-the-counter and prescription medicines only as told by your health care provider. Do not take aspirin, ibuprofen, or other NSAIDs unless your health care provider told you to do so.  Wear loose-fitting clothing. Do not wear anything tight around your waist that causes pressure on your abdomen.  Raise (elevate) the head of your bed about 6 inches (15 cm).  Avoid bending over if this makes your symptoms worse.  Keep all follow-up visits as told by your health care provider. This is important. Contact a health care provider if:  You have: ? New symptoms. ? Unexplained weight loss. ? Difficulty swallowing or it  hurts to swallow. ? Wheezing or a persistent cough. ? A hoarse voice.  Your symptoms do not improve with treatment. Get help right away if you:  Have pain in your arms, neck, jaw, teeth, or back.  Feel sweaty, dizzy, or light-headed.  Have chest pain or shortness of breath.  Vomit and your vomit looks like blood or coffee grounds.  Faint.  Have stool that is bloody or black.  Cannot swallow, drink, or eat. Summary  Gastroesophageal reflux happens when acid from the stomach flows up into the esophagus. GERD is a disease in which the reflux happens often, causes frequent or severe symptoms, or causes problems such as damage to the esophagus.  Treatment for this condition may vary depending on how severe your symptoms are. Your health care provider may recommend diet and lifestyle changes, medicine, or surgery.  Contact a health care provider if you have new or worsening symptoms.  Take over-the-counter and prescription medicines only as told by your health care provider. Do not take aspirin, ibuprofen, or other NSAIDs unless your health care provider told you to do so.  Keep all follow-up visits as told by your health care provider. This is important. This information is not intended to replace advice given to you by your health care provider. Make sure you discuss any questions you have with your health care provider. Document Revised: 11/08/2017 Document Reviewed: 11/08/2017 Elsevier Patient Education  2020 [place] for Gastroesophageal Reflux Disease, Adult When you have gastroesophageal reflux disease (GERD), the foods you eat and your eating habits are very important. Choosing the right foods can help ease the discomfort of GERD. Consider working with a diet and nutrition specialist (dietitian) to help you make healthy food choices. What general guidelines should I follow?  Eating plan  Choose healthy foods low in fat, such as fruits, vegetables, whole  grains, low-fat dairy products, and lean meat, fish, and poultry.  Eat frequent, small meals instead of three large meals each day. Eat your meals slowly, in a relaxed setting. Avoid bending over or lying down until 2-3 hours after eating.  Limit high-fat foods such as fatty meats or fried foods.  Limit your intake of oils,  butter, and shortening to less than 8 teaspoons each day.  Avoid the following: ? Foods that cause symptoms. These may be different for different people. Keep a food diary to keep track of foods that cause symptoms. ? Alcohol. ? Drinking large amounts of liquid with meals. ? Eating meals during the 2-3 hours before bed.  Cook foods using methods other than frying. This may include baking, grilling, or broiling. Lifestyle  Maintain a healthy weight. Ask your health care provider what weight is healthy for you. If you need to lose weight, work with your health care provider to do so safely.  Exercise for at least 30 minutes on 5 or more days each week, or as told by your health care provider.  Avoid wearing clothes that fit tightly around your waist and chest.  Do not use any products that contain nicotine or tobacco, such as cigarettes and e-cigarettes. If you need help quitting, ask your health care provider.  Sleep with the head of your bed raised. Use a wedge under the mattress or blocks under the bed frame to raise the head of the bed. What foods are not recommended? The items listed may not be a complete list. Talk with your dietitian about what dietary choices are best for you. Grains Pastries or quick breads with added fat. [place] toast. Vegetables Deep fried vegetables. [place] fries. Any vegetables prepared with added fat. Any vegetables that cause symptoms. For some people this may include tomatoes and tomato products, chili peppers, onions and garlic, and horseradish. Fruits Any fruits prepared with added fat. Any fruits that cause symptoms. For some  people this may include citrus fruits, such as oranges, grapefruit, pineapple, and lemons. Meats and other protein foods High-fat meats, such as fatty beef or pork, hot dogs, ribs, ham, sausage, salami and bacon. Fried meat or protein, including fried fish and fried chicken. Nuts and nut butters. Dairy Whole milk and chocolate milk. Sour cream. Cream. Ice cream. Cream cheese. Milk shakes. Beverages Coffee and tea, with or without caffeine. Carbonated beverages. Sodas. Energy drinks. Fruit juice made with acidic fruits (such as orange or grapefruit). Tomato juice. Alcoholic drinks. Fats and oils Butter. Margarine. Shortening. Ghee. Sweets and desserts Chocolate and cocoa. Donuts. Seasoning and other foods Pepper. Peppermint and spearmint. Any condiments, herbs, or seasonings that cause symptoms. For some people, this may include curry, hot sauce, or vinegar-based salad dressings. Summary  When you have gastroesophageal reflux disease (GERD), food and lifestyle choices are very important to help ease the discomfort of GERD.  Eat frequent, small meals instead of three large meals each day. Eat your meals slowly, in a relaxed setting. Avoid bending over or lying down until 2-3 hours after eating.  Limit high-fat foods such as fatty meat or fried foods. This information is not intended to replace advice given to you by your health care provider. Make sure you discuss any questions you have with your health care provider. Document Revised: 08/23/2018 Document Reviewed: 05/03/2016 Elsevier Patient Education  [place].

## 2020-02-11 ENCOUNTER — Other Ambulatory Visit: Payer: Self-pay

## 2020-02-11 ENCOUNTER — Encounter: Payer: Self-pay | Admitting: Internal Medicine

## 2020-02-11 ENCOUNTER — Other Ambulatory Visit
Admission: RE | Admit: 2020-02-11 | Discharge: 2020-02-11 | Disposition: A | Discharge status: Home / Self Care | Payer: 59 | Attending: Internal Medicine | Admitting: Internal Medicine

## 2020-02-11 DIAGNOSIS — Z13220 Encounter for screening for lipoid disorders: Secondary | ICD-10-CM | POA: Insufficient documentation

## 2020-02-11 DIAGNOSIS — Z1389 Encounter for screening for other disorder: Secondary | ICD-10-CM | POA: Insufficient documentation

## 2020-02-11 DIAGNOSIS — Z1329 Encounter for screening for other suspected endocrine disorder: Secondary | ICD-10-CM | POA: Insufficient documentation

## 2020-02-11 DIAGNOSIS — G35 Multiple sclerosis: Secondary | ICD-10-CM | POA: Diagnosis not present

## 2020-02-11 DIAGNOSIS — Z0000 Encounter for general adult medical examination without abnormal findings: Secondary | ICD-10-CM | POA: Diagnosis not present

## 2020-02-11 LAB — URINALYSIS, ROUTINE W REFLEX MICROSCOPIC
Bacteria, UA: NONE SEEN
Bilirubin Urine: NEGATIVE
Glucose, UA: NEGATIVE mg/dL
Hgb urine dipstick: NEGATIVE
Ketones, ur: NEGATIVE mg/dL
Nitrite: NEGATIVE
Protein, ur: NEGATIVE mg/dL
Specific Gravity, Urine: 1.008 (ref 1.005–1.030)
pH: 7 (ref 5.0–8.0)

## 2020-02-11 LAB — COMPREHENSIVE METABOLIC PANEL
ALT: 18 U/L (ref 0–44)
AST: 17 U/L (ref 15–41)
Albumin: 4.4 g/dL (ref 3.5–5.0)
Alkaline Phosphatase: 65 U/L (ref 38–126)
Anion gap: 9 (ref 5–15)
BUN: 19 mg/dL (ref 6–20)
CO2: 26 mmol/L (ref 22–32)
Calcium: 9.1 mg/dL (ref 8.9–10.3)
Chloride: 103 mmol/L (ref 98–111)
Creatinine, Ser: 0.68 mg/dL (ref 0.44–1.00)
GFR calc Af Amer: >60 mL/min (ref >60)
GFR calc non Af Amer: >60 mL/min (ref >60)
Glucose, Bld: 98 mg/dL (ref 70–99)
Potassium: 4.1 mmol/L (ref 3.5–5.1)
Sodium: 138 mmol/L (ref 135–145)
Total Bilirubin: 1.1 mg/dL (ref 0.3–1.2)
Total Protein: 7.5 g/dL (ref 6.5–8.1)

## 2020-02-11 LAB — T4, FREE: Free T4: 0.73 ng/dL (ref 0.61–1.12)

## 2020-02-11 LAB — LIPID PANEL
Cholesterol: 149 mg/dL (ref 0–200)
HDL: 83 mg/dL (ref >40)
LDL Cholesterol: 58 mg/dL (ref 0–99)
Total CHOL/HDL Ratio: 1.8 RATIO
Triglycerides: 40 mg/dL (ref <150)
VLDL: 8 mg/dL (ref 0–40)

## 2020-02-11 LAB — CBC WITH DIFFERENTIAL/PLATELET
Abs Immature Granulocytes: 0.01 10*3/uL (ref 0.00–0.07)
Basophils Absolute: 0 10*3/uL (ref 0.0–0.1)
Basophils Relative: 1 %
Eosinophils Absolute: 0.1 10*3/uL (ref 0.0–0.5)
Eosinophils Relative: 3 %
HCT: 33.1 % — ABNORMAL LOW (ref 36.0–46.0)
Hemoglobin: 11.6 g/dL — ABNORMAL LOW (ref 12.0–15.0)
Immature Granulocytes: 0 %
Lymphocytes Relative: 29 %
Lymphs Abs: 1.2 10*3/uL (ref 0.7–4.0)
MCH: 32.1 pg (ref 26.0–34.0)
MCHC: 35 g/dL (ref 30.0–36.0)
MCV: 91.7 fL (ref 80.0–100.0)
Monocytes Absolute: 0.5 10*3/uL (ref 0.1–1.0)
Monocytes Relative: 11 %
Neutro Abs: 2.4 10*3/uL (ref 1.7–7.7)
Neutrophils Relative %: 56 %
Platelets: 239 10*3/uL (ref 150–400)
RBC: 3.61 MIL/uL — ABNORMAL LOW (ref 3.87–5.11)
RDW: 11.9 % (ref 11.5–15.5)
WBC: 4.3 10*3/uL (ref 4.0–10.5)
nRBC: 0 % (ref 0.0–0.2)

## 2020-02-11 LAB — TSH: TSH: 4.484 u[IU]/mL (ref 0.350–4.500)

## 2020-02-11 NOTE — Addendum Note (Signed)
Addended by: Joycie Peek on: 02/11/2020 07:44 AM   Modules accepted: Orders

## 2020-02-14 ENCOUNTER — Ambulatory Visit: Payer: 59 | Admitting: Cardiology

## 2020-03-02 ENCOUNTER — Encounter: Payer: Self-pay | Admitting: Internal Medicine

## 2020-03-09 ENCOUNTER — Ambulatory Visit (INDEPENDENT_AMBULATORY_CARE_PROVIDER_SITE_OTHER): Payer: 59 | Admitting: Cardiology

## 2020-03-09 ENCOUNTER — Encounter: Payer: Self-pay | Admitting: Cardiology

## 2020-03-09 ENCOUNTER — Other Ambulatory Visit: Payer: Self-pay | Admitting: Cardiology

## 2020-03-09 ENCOUNTER — Other Ambulatory Visit: Payer: Self-pay

## 2020-03-09 VITALS — BP 112/70 | HR 86 | Ht 66.0 in | Wt 152.5 lb

## 2020-03-09 DIAGNOSIS — Z8719 Personal history of other diseases of the digestive system: Secondary | ICD-10-CM | POA: Diagnosis not present

## 2020-03-09 DIAGNOSIS — R079 Chest pain, unspecified: Secondary | ICD-10-CM

## 2020-03-09 MED ORDER — METOPROLOL TARTRATE 100 MG PO TABS: 100.0000 mg | ORAL_TABLET | Freq: Once | ORAL | 0 refills | Status: DC

## 2020-03-09 NOTE — Patient Instructions (Addendum)
Medication Instructions:  Your physician recommends that you continue on your current medications as directed. Please refer to the Current Medication list given to you today.  *If you need a refill on your cardiac medications before your next appointment, please call your pharmacy*   Lab Work: Your physician recommends that you return for lab work in: within 1-2 weeks of your Cardiac CTA once it has been scheduled for [facility].  - Please go to the [facility] You will check in at the front desk to the right as you walk into the atrium. Valet Parking is offered if needed. - No appointment needed. You may go any day between 7 am and 6 pm.  If you have labs (blood work) drawn today and your tests are completely normal, you will receive your results only by: Marland Kitchen MyChart Message (if you have MyChart) OR . A paper copy in the mail If you have any lab test that is abnormal or we need to change your treatment, we will call you to review the results.   Testing/Procedures: Your physician has requested that you have an echocardiogram. Echocardiography is a painless test that uses sound waves to create images of your heart. It provides your doctor with information about the size and shape of your heart and how well your heart's chambers and valves are working. This procedure takes approximately one hour. There are no restrictions for this procedure. You may get an IV, if needed, to receive an ultrasound enhancing agent through to better visualize your heart.    Your cardiac CT will be scheduled at one of the below locations:   [facility] [address] [place], [place] [address] [phone]  [place] [address] [place], [place] [address] [phone]  If scheduled at [facility], please arrive at the [facility] main entrance of [facility] 30 minutes prior to test start time. Proceed to the [facility]  Radiology Department (first floor) to check-in and test prep.  If scheduled at [facility], please arrive 15 mins early for check-in and test prep.  Please follow these instructions carefully (unless otherwise directed):  Hold all erectile dysfunction medications at least 3 days (72 hrs) prior to test.  On the Night Before the Test: . Be sure to Drink plenty of water. . Do not consume any caffeinated/decaffeinated beverages or chocolate 12 hours prior to your test. . Do not take any antihistamines 12 hours prior to your test.  On the Day of the Test: . Drink plenty of water. Do not drink any water within one hour of the test. . Do not eat any food 4 hours prior to the test. . You may take your regular medications prior to the test.  . Take metoprolol (Lopressor) two hours prior to test. . FEMALES- please wear underwire-free bra if available      After the Test: . Drink plenty of water. . After receiving IV contrast, you may experience a mild flushed feeling. This is normal. . On occasion, you may experience a mild rash up to 24 hours after the test. This is not dangerous. If this occurs, you can take Benadryl 25 mg and increase your fluid intake. . If you experience trouble breathing, this can be serious. If it is severe call 911 IMMEDIATELY. If it is mild, please call our office. . If you take any of these medications: Glipizide/Metformin, Avandament, Glucavance, please do not  take 48 hours after completing test unless otherwise instructed.   Once we have confirmed authorization from your insurance company, we will call you to set up a date and time for your test. Based on how quickly your insurance processes prior authorizations requests, please allow up to 4 weeks to be contacted for scheduling your Cardiac CT appointment. Be advised that routine Cardiac CT appointments could be scheduled as many as 8 weeks after your provider has ordered it.  For  non-scheduling related questions, please contact the cardiac imaging nurse navigator should you have any questions/concerns: Marchia Bond, Cardiac Imaging Nurse Navigator Burley Saver, Interim Cardiac Imaging Nurse [place] [facility] Direct Office Dial: [phone]   For scheduling needs, including cancellations and rescheduling, please call Vivien Rota at [phone], option 3.    Follow-Up: At [facility], you and your health needs are our priority.  As part of our continuing mission to provide you with exceptional heart care, we have created designated Provider Care Teams.  These Care Teams include your primary Cardiologist (physician) and Advanced Practice Providers (APPs -  Physician Assistants and Nurse Practitioners) who all work together to provide you with the care you need, when you need it.  We recommend signing up for the patient portal called "MyChart".  Sign up information is provided on this After Visit Summary.  MyChart is used to connect with patients for Virtual Visits (Telemedicine).  Patients are able to view lab/test results, encounter notes, upcoming appointments, etc.  Non-urgent messages can be sent to your provider as well.   To learn more about what you can do with MyChart, go to [email or web address].    Your next appointment:   After testing is completed about 6-7 weeks.   The format for your next appointment:   In Person  Provider:   You may see Kate Sable, MD or one of the following Advanced Practice Providers on your designated Care Team:    Murray Hodgkins, NP  Christell Faith, PA-C  Marrianne Mood, PA-C  Cadence Kathlen Mody, [place]    Cardiac CT Angiogram A cardiac CT angiogram is a procedure to look at the heart and the area around the heart. It may be done to help find the cause of chest pains or other symptoms of heart disease. During this procedure, a substance called contrast dye is injected into the blood vessels in the  area to be checked. A large X-ray machine, called a CT scanner, then takes detailed pictures of the heart and the surrounding area. The procedure is also sometimes called a coronary CT angiogram, coronary artery scanning, or CTA. A cardiac CT angiogram allows the health care provider to see how well blood is flowing to and from the heart. The health care provider will be able to see if there are any problems, such as:  Blockage or narrowing of the coronary arteries in the heart.  Fluid around the heart.  Signs of weakness or disease in the muscles, valves, and tissues of the heart. Tell a health care provider about:  Any allergies you have. This is especially important if you have had a previous allergic reaction to contrast dye.  All medicines you are taking, including vitamins, herbs, eye drops, creams, and over-the-counter medicines.  Any blood disorders you have.  Any surgeries you have had.  Any medical conditions you have.  Whether you are pregnant or may be pregnant.  Any anxiety disorders, chronic pain, or other conditions you have that may increase your stress  or prevent you from lying still. What are the risks? Generally, this is a safe procedure. However, problems may occur, including:  Bleeding.  Infection.  Allergic reactions to medicines or dyes.  Damage to other structures or organs.  Kidney damage from the contrast dye that is used.  Increased risk of cancer from radiation exposure. This risk is low. Talk with your health care provider about: ? The risks and benefits of testing. ? How you can receive the lowest dose of radiation. What happens before the procedure?  Wear comfortable clothing and remove any jewelry, glasses, dentures, and hearing aids.  Follow instructions from your health care provider about eating and drinking. This may include: ? For 12 hours before the procedure -- avoid caffeine. This includes tea, coffee, soda, energy drinks, and diet  pills. Drink plenty of water or other fluids that do not have caffeine in them. Being well hydrated can prevent complications. ? For 4-6 hours before the procedure -- stop eating and drinking. The contrast dye can cause nausea, but this is less likely if your stomach is empty.  Ask your health care provider about changing or stopping your regular medicines. This is especially important if you are taking diabetes medicines, blood thinners, or medicines to treat problems with erections (erectile dysfunction). What happens during the procedure?   Hair on your chest may need to be removed so that small sticky patches called electrodes can be placed on your chest. These will transmit information that helps to monitor your heart during the procedure.  An IV will be inserted into one of your veins.  You might be given a medicine to control your heart rate during the procedure. This will help to ensure that good images are obtained.  You will be asked to lie on an exam table. This table will slide in and out of the CT machine during the procedure.  Contrast dye will be injected into the IV. You might feel warm, or you may get a metallic taste in your mouth.  You will be given a medicine called nitroglycerin. This will relax or dilate the arteries in your heart.  The table that you are lying on will move into the CT machine tunnel for the scan.  The person running the machine will give you instructions while the scans are being done. You may be asked to: ? Keep your arms above your head. ? Hold your breath. ? Stay very still, even if the table is moving.  When the scanning is complete, you will be moved out of the machine.  The IV will be removed. The procedure may vary among health care providers and hospitals. What can I expect after the procedure? After your procedure, it is common to have:  A metallic taste in your mouth from the contrast dye.  A feeling of warmth.  A headache from  the nitroglycerin. Follow these instructions at home:  Take over-the-counter and prescription medicines only as told by your health care provider.  If you are told, drink enough fluid to keep your urine pale yellow. This will help to flush the contrast dye out of your body.  Most people can return to their normal activities right after the procedure. Ask your health care provider what activities are safe for you.  It is up to you to get the results of your procedure. Ask your health care provider, or the department that is doing the procedure, when your results will be ready.  Keep all follow-up visits  as told by your health care provider. This is important. Contact a health care provider if:  You have any symptoms of allergy to the contrast dye. These include: ? Shortness of breath. ? Rash or hives. ? A racing heartbeat. Summary  A cardiac CT angiogram is a procedure to look at the heart and the area around the heart. It may be done to help find the cause of chest pains or other symptoms of heart disease.  During this procedure, a large X-ray machine, called a CT scanner, takes detailed pictures of the heart and the surrounding area after a contrast dye has been injected into blood vessels in the area.  Ask your health care provider about changing or stopping your regular medicines before the procedure. This is especially important if you are taking diabetes medicines, blood thinners, or medicines to treat erectile dysfunction.  If you are told, drink enough fluid to keep your urine pale yellow. This will help to flush the contrast dye out of your body. This information is not intended to replace advice given to you by your health care provider. Make sure you discuss any questions you have with your health care provider. Document Revised: 12/26/2018 Document Reviewed: 12/26/2018 Elsevier Patient Education  [place].

## 2020-03-09 NOTE — Progress Notes (Signed)
Cardiology Office Note:    Date:  03/09/2020   ID:  Pam Hamilton, DOB December 06, 1965, MRN [id number]  PCP:  McLean-Scocuzza, Nino Glow, MD  [facility] Cardiologist:  Kate Sable, MD  [facility] Electrophysiologist:  None   Referring MD: McLean-Scocuzza, Olivia Mackie *   Chief Complaint  Patient presents with  . OTHER    Chest pain. Meds reviewed verbally with pt.   Pam Hamilton is a 54 y.o. female who is being seen today for the evaluation of chest pain at the request of McLean-Scocuzza, Olivia Mackie *.   History of Present Illness:    Pam Hamilton is a 54 y.o. female with a hx of multiple sclerosis who presents due to chest pain. Patient states having symptoms of chest pain on and off for over a year now. Symptoms seem to have worsened over the past several months. Symptoms are not related with exertion. She sometimes wakes up in the middle of the night with chest discomfort. Last occurrence was 4 weeks ago. Rates symptoms as 9 out of 10 in severity lasting couple of minutes. She took Tums which help with symptoms a little bit. She states having a history of reflux, previously took ibuprofen for MS symptoms, but her symptoms of reflux improved since stopping ibuprofen. She denies smoking, denies any history of heart disease, denies shortness of breath, palpitations.  Past Medical History:  Diagnosis Date  . Depression    1997  . Endometrial polyp   . Family history of breast cancer 03/2018   cancer genetic testing letter sent  . Family history of ovarian cancer   . Multiple sclerosis ([place])   . Shingles    right upper arm ? year 2015 or prior   . Urine incontinence 2015   2 Episodes     Past Surgical History:  Procedure Laterality Date  . BREAST CYST ASPIRATION     neg  . CESAREAN SECTION    . COLONOSCOPY WITH PROPOFOL N/A 11/18/2016   Procedure: COLONOSCOPY WITH PROPOFOL;  Surgeon: Jonathon Bellows, MD;  Location: [facility] ENDOSCOPY;  Service: Endoscopy;  Laterality:  N/A;  . INTRAUTERINE DEVICE (IUD) INSERTION  01/02/2014   Mirena  . OVARY SURGERY  6/252015   Right ovary removed.  Benign cyst.  . POLYPECTOMY  2008,11/07/13   Removal of polyps from uterine. Vernie Ammons)  . SALPINGECTOMY  11/07/2013   Right.Marland KitchenMarland KitchenHemorrhagic cyst  . Sonohysterogram  05/07/2007   [place]    Current Medications: Current Meds  Medication Sig  . Cholecalciferol (VITAMIN D) 2000 units CAPS Take 2,000 Units by mouth daily.   . Dimethyl Fumarate 240 MG CPDR Take 1 capsule (240 mg total) by mouth 2 (two) times daily. (Patient taking differently: Take 240 mg by mouth 2 (two) times daily. [place] for MS)  . gabapentin (NEURONTIN) 100 MG capsule Take 1 capsule (100 mg total) by mouth at bedtime.     Allergies:   Ciprofloxacin and Septra [sulfamethoxazole-trimethoprim]   Social History   Socioeconomic History  . Marital status: Married    Spouse name: Not on file  . Number of children: Not on file  . Years of education: Not on file  . Highest education level: Not on file  Occupational History  . Not on file  Tobacco Use  . Smoking status: Never Smoker  . Smokeless tobacco: Never Used  Vaping Use  . Vaping Use: Never used  Substance and Sexual Activity  . Alcohol use: Yes    Alcohol/week: 1.0 standard drink  Types: 1 Glasses of wine per week    Comment: Once a month   . Drug use: No  . Sexual activity: Yes    Birth control/protection: None  Other Topics Concern  . Not on file  Social History Narrative   Lives in [place] with Husband   2 children 51 and 17 both boys   1 Cat and 1 Rabbit   [employer]   Enjoys attending church, being with her boys       Social Determinants of Health   Financial Resource Strain:   . Difficulty of Paying Living Expenses: Not on file  Food Insecurity:   . Worried About Charity fundraiser in the Last Year: Not on file  . Ran Out of Food in the Last Year: Not on file  Transportation Needs:   . Lack of  Transportation (Medical): Not on file  . Lack of Transportation (Non-Medical): Not on file  Physical Activity:   . Days of Exercise per Week: Not on file  . Minutes of Exercise per Session: Not on file  Stress:   . Feeling of Stress : Not on file  Social Connections:   . Frequency of Communication with Friends and Family: Not on file  . Frequency of Social Gatherings with Friends and Family: Not on file  . Attends Religious Services: Not on file  . Active Member of Clubs or Organizations: Not on file  . Attends Archivist Meetings: Not on file  . Marital Status: Not on file     Family History: The patient's family history includes Alcohol abuse in her father; Anuerysm in her paternal grandmother; Breast cancer (age of onset: 63) in her cousin; Breast cancer (age of onset: 81) in her maternal aunt; Diabetes in her mother; Hyperlipidemia in her mother; Hypertension in her mother; Hypothyroidism in her mother; Osteoarthritis in her mother; Ovarian cancer (age of onset: 72) in an other family member.  ROS:   Please see the history of present illness.     All other systems reviewed and are negative.  EKGs/Labs/Other Studies Reviewed:    The following studies were reviewed today:   EKG:  EKG is  ordered today.  The ekg ordered today demonstrates normal sinus rhythm, normal ECG.  Recent Labs: 02/11/2020: ALT 18; BUN 19; Creatinine, Ser 0.68; Hemoglobin 11.6; Platelets 239; Potassium 4.1; Sodium 138; TSH 4.484  Recent Lipid Panel    Component Value Date/Time   CHOL 149 02/11/2020 0748   TRIG 40 02/11/2020 0748   HDL 83 02/11/2020 0748   CHOLHDL 1.8 02/11/2020 0748   VLDL 8 02/11/2020 0748   LDLCALC 58 02/11/2020 0748     Risk Assessment/Calculations:      Physical Exam:    VS:  BP 112/70 (BP Location: Right Arm, Patient Position: Sitting, Cuff Size: Normal)   Pulse 86   Ht 5' 6"  (1.676 m)   Wt 152 lb 8 oz (69.2 kg)   LMP 01/26/2018   SpO2 98%   BMI 24.61 kg/m      Wt Readings from Last 3 Encounters:  03/09/20 152 lb 8 oz (69.2 kg)  02/06/20 155 lb 6.4 oz (70.5 kg)  05/29/19 150 lb (68 kg)     GEN:  Well nourished, well developed in no acute distress HEENT: Normal NECK: No JVD; No carotid bruits LYMPHATICS: No lymphadenopathy CARDIAC: RRR, no murmurs, rubs, gallops RESPIRATORY:  Clear to auscultation without rales, wheezing or rhonchi  ABDOMEN: Soft, non-tender, non-distended MUSCULOSKELETAL:  No edema; No deformity  SKIN: Warm and dry NEUROLOGIC:  Alert and oriented x 3 PSYCHIATRIC:  Normal affect   ASSESSMENT:    1. Chest pain, unspecified type   2. Hx of gastroesophageal reflux (GERD)    PLAN:    In order of problems listed above:  1. Patient with symptoms of chest pain. Her symptoms appear atypical, no traditional cardiac risk factors. She however has a history of MS which has been reported to be associated with cardiac disease. We will get coronary CTA to evaluate CAD. Get echo to evaluate systolic and diastolic function.  2. History of reflux, start OTC Prilosec 20 mg daily. Patient has an upcoming appointment with gastroenterology.  Follow-up after echo and coronary CTA.   Medication Adjustments/Labs and Tests Ordered: Current medicines are reviewed at length with the patient today.  Concerns regarding medicines are outlined above.  Orders Placed This Encounter  Procedures  . CT CORONARY MORPH W/CTA COR W/SCORE W/CA W/CM &/OR WO/CM  . CT CORONARY FRACTIONAL FLOW RESERVE DATA PREP  . CT CORONARY FRACTIONAL FLOW RESERVE FLUID ANALYSIS  . Basic metabolic panel  . EKG 12-Lead  . ECHOCARDIOGRAM COMPLETE   Meds ordered this encounter  Medications  . metoprolol tartrate (LOPRESSOR) 100 MG tablet    Sig: Take 1 tablet (100 mg total) by mouth once for 1 dose. Take 2 hours prior to CT.    Dispense:  1 tablet    Refill:  0    Patient Instructions  Medication Instructions:  Your physician recommends that you continue on  your current medications as directed. Please refer to the Current Medication list given to you today.  *If you need a refill on your cardiac medications before your next appointment, please call your pharmacy*   Lab Work: Your physician recommends that you return for lab work in: within 1-2 weeks of your Cardiac CTA once it has been scheduled for BMET.  - Please go to the [facility]. You will check in at the front desk to the right as you walk into the atrium. Valet Parking is offered if needed. - No appointment needed. You may go any day between 7 am and 6 pm.  If you have labs (blood work) drawn today and your tests are completely normal, you will receive your results only by: Marland Kitchen MyChart Message (if you have MyChart) OR . A paper copy in the mail If you have any lab test that is abnormal or we need to change your treatment, we will call you to review the results.   Testing/Procedures: Your physician has requested that you have an echocardiogram. Echocardiography is a painless test that uses sound waves to create images of your heart. It provides your doctor with information about the size and shape of your heart and how well your heart's chambers and valves are working. This procedure takes approximately one hour. There are no restrictions for this procedure. You may get an IV, if needed, to receive an ultrasound enhancing agent through to better visualize your heart.    Your cardiac CT will be scheduled at one of the below locations:   [facility] [address] [place], [place] [address] [phone]  [place] [address] [place], [place] [address] [phone]  If scheduled at [facility], please arrive at the [facility] main entrance of [facility] 30 minutes prior to test start time. Proceed to the [facility]  Radiology Department (first floor) to check-in and test prep.  If  scheduled at [facility], please arrive 15 mins early for check-in and test prep.  Please follow these instructions carefully (unless otherwise directed):  Hold all erectile dysfunction medications at least 3 days (72 hrs) prior to test.  On the Night Before the Test: . Be sure to Drink plenty of water. . Do not consume any caffeinated/decaffeinated beverages or chocolate 12 hours prior to your test. . Do not take any antihistamines 12 hours prior to your test.  On the Day of the Test: . Drink plenty of water. Do not drink any water within one hour of the test. . Do not eat any food 4 hours prior to the test. . You may take your regular medications prior to the test.  . Take metoprolol (Lopressor) two hours prior to test. . FEMALES- please wear underwire-free bra if available      After the Test: . Drink plenty of water. . After receiving IV contrast, you may experience a mild flushed feeling. This is normal. . On occasion, you may experience a mild rash up to 24 hours after the test. This is not dangerous. If this occurs, you can take Benadryl 25 mg and increase your fluid intake. . If you experience trouble breathing, this can be serious. If it is severe call 911 IMMEDIATELY. If it is mild, please call our office. . If you take any of these medications: Glipizide/Metformin, Avandament, Glucavance, please do not take 48 hours after completing test unless otherwise instructed.   Once we have confirmed authorization from your insurance company, we will call you to set up a date and time for your test. Based on how quickly your insurance processes prior authorizations requests, please allow up to 4 weeks to be contacted for scheduling your Cardiac CT appointment. Be advised that routine Cardiac CT appointments could be scheduled as many as 8 weeks after your provider has ordered it.  For non-scheduling related questions, please contact the cardiac imaging nurse  navigator should you have any questions/concerns: Marchia Bond, Cardiac Imaging Nurse Navigator Burley Saver, Interim Cardiac Imaging Nurse [facility] Direct Office Dial: [phone]   For scheduling needs, including cancellations and rescheduling, please call Vivien Rota at [phone], option 3.    Follow-Up: At [facility], you and your health needs are our priority.  As part of our continuing mission to provide you with exceptional heart care, we have created designated Provider Care Teams.  These Care Teams include your primary Cardiologist (physician) and Advanced Practice Providers (APPs -  Physician Assistants and Nurse Practitioners) who all work together to provide you with the care you need, when you need it.  We recommend signing up for the patient portal called "MyChart".  Sign up information is provided on this After Visit Summary.  MyChart is used to connect with patients for Virtual Visits (Telemedicine).  Patients are able to view lab/test results, encounter notes, upcoming appointments, etc.  Non-urgent messages can be sent to your provider as well.   To learn more about what you can do with MyChart, go to [email or web address].    Your next appointment:   After testing is completed about 6-7 weeks.   The format for your next appointment:   In Person  Provider:   You may see Kate Sable, MD or one of the following Advanced Practice Providers on your designated Care Team:    Murray Hodgkins, NP  Thurmond Butts  Dunn, PA-C  Marrianne Mood, PA-C  Cadence Leeds, [place]    Cardiac CT Angiogram A cardiac CT angiogram is a procedure to look at the heart and the area around the heart. It may be done to help find the cause of chest pains or other symptoms of heart disease. During this procedure, a substance called contrast dye is injected into the blood vessels in the area to be checked. A large X-ray machine, called a CT scanner, then takes  detailed pictures of the heart and the surrounding area. The procedure is also sometimes called a coronary CT angiogram, coronary artery scanning, or CTA. A cardiac CT angiogram allows the health care provider to see how well blood is flowing to and from the heart. The health care provider will be able to see if there are any problems, such as:  Blockage or narrowing of the coronary arteries in the heart.  Fluid around the heart.  Signs of weakness or disease in the muscles, valves, and tissues of the heart. Tell a health care provider about:  Any allergies you have. This is especially important if you have had a previous allergic reaction to contrast dye.  All medicines you are taking, including vitamins, herbs, eye drops, creams, and over-the-counter medicines.  Any blood disorders you have.  Any surgeries you have had.  Any medical conditions you have.  Whether you are pregnant or may be pregnant.  Any anxiety disorders, chronic pain, or other conditions you have that may increase your stress or prevent you from lying still. What are the risks? Generally, this is a safe procedure. However, problems may occur, including:  Bleeding.  Infection.  Allergic reactions to medicines or dyes.  Damage to other structures or organs.  Kidney damage from the contrast dye that is used.  Increased risk of cancer from radiation exposure. This risk is low. Talk with your health care provider about: ? The risks and benefits of testing. ? How you can receive the lowest dose of radiation. What happens before the procedure?  Wear comfortable clothing and remove any jewelry, glasses, dentures, and hearing aids.  Follow instructions from your health care provider about eating and drinking. This may include: ? For 12 hours before the procedure -- avoid caffeine. This includes tea, coffee, soda, energy drinks, and diet pills. Drink plenty of water or other fluids that do not have caffeine in  them. Being well hydrated can prevent complications. ? For 4-6 hours before the procedure -- stop eating and drinking. The contrast dye can cause nausea, but this is less likely if your stomach is empty.  Ask your health care provider about changing or stopping your regular medicines. This is especially important if you are taking diabetes medicines, blood thinners, or medicines to treat problems with erections (erectile dysfunction). What happens during the procedure?   Hair on your chest may need to be removed so that small sticky patches called electrodes can be placed on your chest. These will transmit information that helps to monitor your heart during the procedure.  An IV will be inserted into one of your veins.  You might be given a medicine to control your heart rate during the procedure. This will help to ensure that good images are obtained.  You will be asked to lie on an exam table. This table will slide in and out of the CT machine during the procedure.  Contrast dye will be injected into the IV. You might feel warm, or you may get  a metallic taste in your mouth.  You will be given a medicine called nitroglycerin. This will relax or dilate the arteries in your heart.  The table that you are lying on will move into the CT machine tunnel for the scan.  The person running the machine will give you instructions while the scans are being done. You may be asked to: ? Keep your arms above your head. ? Hold your breath. ? Stay very still, even if the table is moving.  When the scanning is complete, you will be moved out of the machine.  The IV will be removed. The procedure may vary among health care providers and hospitals. What can I expect after the procedure? After your procedure, it is common to have:  A metallic taste in your mouth from the contrast dye.  A feeling of warmth.  A headache from the nitroglycerin. Follow these instructions at home:  Take  over-the-counter and prescription medicines only as told by your health care provider.  If you are told, drink enough fluid to keep your urine pale yellow. This will help to flush the contrast dye out of your body.  Most people can return to their normal activities right after the procedure. Ask your health care provider what activities are safe for you.  It is up to you to get the results of your procedure. Ask your health care provider, or the department that is doing the procedure, when your results will be ready.  Keep all follow-up visits as told by your health care provider. This is important. Contact a health care provider if:  You have any symptoms of allergy to the contrast dye. These include: ? Shortness of breath. ? Rash or hives. ? A racing heartbeat. Summary  A cardiac CT angiogram is a procedure to look at the heart and the area around the heart. It may be done to help find the cause of chest pains or other symptoms of heart disease.  During this procedure, a large X-ray machine, called a CT scanner, takes detailed pictures of the heart and the surrounding area after a contrast dye has been injected into blood vessels in the area.  Ask your health care provider about changing or stopping your regular medicines before the procedure. This is especially important if you are taking diabetes medicines, blood thinners, or medicines to treat erectile dysfunction.  If you are told, drink enough fluid to keep your urine pale yellow. This will help to flush the contrast dye out of your body. This information is not intended to replace advice given to you by your health care provider. Make sure you discuss any questions you have with your health care provider. Document Revised: 12/26/2018 Document Reviewed: 12/26/2018 Elsevier Patient Education  2020 [place], Kate Sable, MD  03/09/2020 5:01 PM    [place]

## 2020-03-18 ENCOUNTER — Telehealth (HOSPITAL_COMMUNITY): Payer: Self-pay | Admitting: Emergency Medicine

## 2020-03-18 MED FILL — DIMETHYL FUMARATE 240 MG CP: 240 | 30 days supply | Qty: 60 | Fill #3

## 2020-03-18 NOTE — Telephone Encounter (Signed)
Attempted to call patient regarding upcoming cardiac CT appointment. °Left message on voicemail with name and callback number °Nelline Lio RN Navigator Cardiac Imaging °[facility] °[phone] Office °[phone] Cell ° °

## 2020-03-18 NOTE — Telephone Encounter (Signed)
Reaching out to patient to offer assistance regarding upcoming cardiac imaging study; pt verbalizes understanding of appt date/time, parking situation and where to check in, pre-test NPO status and medications ordered, and verified current allergies; name and call back number provided for further questions should they arise Dannell Raczkowski RN Navigator Cardiac Imaging [facility] [phone] office [phone] cell 

## 2020-03-19 ENCOUNTER — Other Ambulatory Visit: Payer: Self-pay

## 2020-03-19 ENCOUNTER — Ambulatory Visit
Admission: RE | Admit: 2020-03-19 | Discharge: 2020-03-19 | Disposition: A | Discharge status: Home / Self Care | Payer: 59 | Source: Ambulatory Visit | Attending: Cardiology | Admitting: Cardiology

## 2020-03-19 DIAGNOSIS — R079 Chest pain, unspecified: Secondary | ICD-10-CM | POA: Diagnosis not present

## 2020-03-19 MED ORDER — DILTIAZEM HCL 25 MG/5ML IV SOLN
10.0000 mg | Freq: Once | INTRAVENOUS | Status: AC
  Administered 2020-03-19: 10 mg via INTRAVENOUS

## 2020-03-19 MED ORDER — IOHEXOL 350 MG/ML SOLN
75.0000 mL | Freq: Once | INTRAVENOUS | Status: AC | PRN
  Administered 2020-03-19: 75 mL via INTRAVENOUS

## 2020-03-19 MED ORDER — METOPROLOL TARTRATE 5 MG/5ML IV SOLN
10.0000 mg | Freq: Once | INTRAVENOUS | Status: AC
  Administered 2020-03-19: 10 mg via INTRAVENOUS

## 2020-03-19 MED ORDER — NITROGLYCERIN 0.4 MG SL SUBL
0.8000 mg | SUBLINGUAL_TABLET | Freq: Once | SUBLINGUAL | Status: AC
  Administered 2020-03-19: 0.8 mg via SUBLINGUAL

## 2020-03-19 NOTE — Progress Notes (Signed)
Patient tolerated procedure well. Ambulate w/o difficulty. Sitting in chair drinking water provided and eating peanut butter and graham craackers. Encouraged to drink extra water today and reasoning explained. Verbalized understanding. All questions answered. ABC intact. No further needs. Discharge from procedure area w/o issues.

## 2020-03-26 ENCOUNTER — Ambulatory Visit (INDEPENDENT_AMBULATORY_CARE_PROVIDER_SITE_OTHER): Payer: 59

## 2020-03-26 ENCOUNTER — Other Ambulatory Visit: Payer: Self-pay

## 2020-03-26 DIAGNOSIS — R079 Chest pain, unspecified: Secondary | ICD-10-CM | POA: Diagnosis not present

## 2020-03-27 LAB — ECHOCARDIOGRAM COMPLETE
Area-P 1/2: 3.83 cm2
S' Lateral: 2.6 cm

## 2020-04-01 ENCOUNTER — Telehealth: Payer: Self-pay | Admitting: *Deleted

## 2020-04-01 NOTE — Telephone Encounter (Signed)
Called patient back and left a detailed VM per DPR on file pertaining to the result note as copied below:  Normal systolic and diastolic function. Trivial MR overall okay echocardiogram with no findings to suggest etiology of chest pain.   Encouraged patient to call back with any further questions or concerns.

## 2020-04-01 NOTE — Telephone Encounter (Signed)
Attempted to call pt to give results. LMOM TCB.

## 2020-04-01 NOTE — Telephone Encounter (Signed)
-----   Message from Kate Sable, MD sent at 03/30/2020  6:14 PM EST ----- Normal systolic and diastolic function.  Trivial MR overall okay echocardiogram with no findings to suggest etiology of chest pain.

## 2020-04-01 NOTE — Telephone Encounter (Signed)
Patient calling to discuss recent testing results  ° °Please call  ° °

## 2020-04-17 ENCOUNTER — Ambulatory Visit (INDEPENDENT_AMBULATORY_CARE_PROVIDER_SITE_OTHER): Payer: 59 | Admitting: Cardiology

## 2020-04-17 ENCOUNTER — Other Ambulatory Visit: Payer: Self-pay

## 2020-04-17 ENCOUNTER — Encounter: Payer: Self-pay | Admitting: Cardiology

## 2020-04-17 VITALS — BP 116/70 | HR 112 | Ht 66.0 in | Wt 152.0 lb

## 2020-04-17 DIAGNOSIS — R079 Chest pain, unspecified: Secondary | ICD-10-CM | POA: Diagnosis not present

## 2020-04-17 DIAGNOSIS — Z8719 Personal history of other diseases of the digestive system: Secondary | ICD-10-CM

## 2020-04-17 NOTE — Progress Notes (Signed)
Cardiology Office Note:    Date:  04/17/2020   ID:  Pam Hamilton, DOB 1966-04-23, MRN [id number]  PCP:  McLean-Scocuzza, Nino Glow, MD  [facility] Cardiologist:  Kate Sable, MD  [place] Electrophysiologist:  None   Referring MD: McLean-Scocuzza, Olivia Mackie *   Chief Complaint  Patient presents with  . office visit    F/U appointment-no new cardiacconcerns; Meds verbally reviewed with patient.     History of Present Illness:    Pam Hamilton is a 54 y.o. female with a hx of multiple sclerosis who presents for follow-up.  She was last seen due to chest pain.  Symptoms were ongoing for over a year.  Echo and coronary CTA were placed to evaluate presence of CAD, as patient has history of MS, associated with cardiac disease.  Has no new concerns or complaints at this time.  Has not had any further chest discomfort since our last meeting.  Has been taking PPI/Protonix for reflux disease as recommended.  Has an upcoming appointment with gastroenterology.   Past Medical History:  Diagnosis Date  . Depression    1997  . Endometrial polyp   . Family history of breast cancer 03/2018   cancer genetic testing letter sent  . Family history of ovarian cancer   . Multiple sclerosis ([place])   . Shingles    right upper arm ? year 2015 or prior   . Urine incontinence 2015   2 Episodes     Past Surgical History:  Procedure Laterality Date  . BREAST CYST ASPIRATION     neg  . CESAREAN SECTION    . COLONOSCOPY WITH PROPOFOL N/A 11/18/2016   Procedure: COLONOSCOPY WITH PROPOFOL;  Surgeon: Jonathon Bellows, MD;  Location[facility] END[facility]COPY;  Service: Endoscopy;  Laterality: N/A;  . INTRAUTERINE DEVICE (IUD) INSERTION  01/02/2014   Mirena  . OVARY SURGERY  6/252015   Right ovary removed.  Benign cyst.  . POLYPECTOMY  2008,11/07/13   Removal of polyps from uterine. Vernie Ammons)  . SALPINGECTOMY  11/07/2013   Right.Marland KitchenMarland KitchenHemorrhagic cyst  . Sonohysterogram  05/07/2007   VanDalen     Current Medications: Current Meds  Medication Sig  . Cholecalciferol (VITAMIN D) 2000 units CAPS Take 2,000 Units by mouth daily.   . Dimethyl Fumarate 240 MG CPDR Take 1 capsule (240 mg total) by mouth 2 (two) times daily.     Allergies:   Ciprofloxacin and Septra [sulfamethoxazole-trimethoprim]   Social History   Socioeconomic History  . Marital status: Married    Spouse name: Not on file  . Number of children: Not on file  . Years of education: Not on file  . Highest education level: Not on file  Occupational History  . Not on file  Tobacco Use  . Smoking status: Never Smoker  . Smokeless tobacco: Never Used  Vaping Use  . Vaping Use: Never used  Substance and Sexual Activity  . Alcohol use: Not Currently    Alcohol/week: 1.0 standard drink    Types: 1 Glasses of wine per week    Comment: Once a month   . Drug use: No  . Sexual activity: Yes    Birth control/protection: None  Other Topics Concern  . Not on file  Social History Narrative   Lives in [place] with Husband   2 children 37 and 17 both boys   1 Cat and 1 Rabbit   [employer]   Enjoys attending church, being with her boys  Social Determinants of Health   Financial Resource Strain:   . Difficulty of Paying Living Expenses: Not on file  Food Insecurity:   . Worried About Charity fundraiser in the Last Year: Not on file  . Ran Out of Food in the Last Year: Not on file  Transportation Needs:   . Lack of Transportation (Medical): Not on file  . Lack of Transportation (Non-Medical): Not on file  Physical Activity:   . Days of Exercise per Week: Not on file  . Minutes of Exercise per Session: Not on file  Stress:   . Feeling of Stress : Not on file  Social Connections:   . Frequency of Communication with Friends and Family: Not on file  . Frequency of Social Gatherings with Friends and Family: Not on file  . Attends Religious Services: Not on file  . Active Member of  Clubs or Organizations: Not on file  . Attends Archivist Meetings: Not on file  . Marital Status: Not on file     Family History: The patient's family history includes Alcohol abuse in her father; Anuerysm in her paternal grandmother; Breast cancer (age of onset: 34) in her cousin; Breast cancer (age of onset: 9) in her maternal aunt; Diabetes in her mother; Hyperlipidemia in her mother; Hypertension in her mother; Hypothyroidism in her mother; Osteoarthritis in her mother; Ovarian cancer (age of onset: 53) in an other family member.  ROS:   Please see the history of present illness.     All other systems reviewed and are negative.  EKGs/Labs/Other Studies Reviewed:    The following studies were reviewed today:   EKG:  EKG not  ordered today.   Recent Labs: 02/11/2020: ALT 18; BUN 19; Creatinine, Ser 0.68; Hemoglobin 11.6; Platelets 239; Potassium 4.1; Sodium 138; TSH 4.484  Recent Lipid Panel    Component Value Date/Time   CHOL 149 02/11/2020 0748   TRIG 40 02/11/2020 0748   HDL 83 02/11/2020 0748   CHOLHDL 1.8 02/11/2020 0748   VLDL 8 02/11/2020 0748   LDLCALC 58 02/11/2020 0748     Risk Assessment/Calculations:      Physical Exam:    VS:  BP 116/70 (BP Location: Left Arm, Patient Position: Sitting, Cuff Size: Normal)   Pulse (!) 112   Ht 5\' 6"  (1.676 m)   Wt 152 lb (68.9 kg)   LMP 01/26/2018   SpO2 97%   BMI 24.53 kg/m     Wt Readings from Last 3 Encounters:  04/17/20 152 lb (68.9 kg)  03/09/20 152 lb 8 oz (69.2 kg)  02/06/20 155 lb 6.4 oz (70.5 kg)     GEN:  Well nourished, well developed in no acute distress HEENT: Normal NECK: No JVD; No carotid bruits LYMPHATICS: No lymphadenopathy CARDIAC: RRR, no murmurs, rubs, gallops RESPIRATORY:  Clear to auscultation without rales, wheezing or rhonchi  ABDOMEN: Soft, non-tender, non-distended MUSCULOSKELETAL:  No edema; No deformity  SKIN: Warm and dry NEUROLOGIC:  Alert and oriented x  3 PSYCHIATRIC:  Normal affect   ASSESSMENT:    1. Chest pain, unspecified type   2. Hx of gastroesophageal reflux (GERD)    PLAN:    In order of problems listed above:  1. Patient with symptoms of chest pain.  Echo with normal systolic and diastolic function, trivial MR, EF 60 to 65%.  Coronary CTA with calcium score of 0, no evidence of CAD.  Patient made aware of results and reassured.  Currently asymptomatic. 2.  History of reflux, continue Prilosec.  Has appointment with GI Coming.  Follow-up as needed   Medication Adjustments/Labs and Tests Ordered: Current medicines are reviewed at length with the patient today.  Concerns regarding medicines are outlined above.  No orders of the defined types were placed in this encounter.  No orders of the defined types were placed in this encounter.   Patient Instructions  Medication Instructions:  Your physician recommends that you continue on your current medications as directed. Please refer to the Current Medication list given to you today.  *If you need a refill on your cardiac medications before your next appointment, please call your pharmacy*   Lab Work: None Ordered If you have labs (blood work) drawn today and your tests are completely normal, you will receive your results only by: Marland Kitchen MyChart Message (if you have MyChart) OR . A paper copy in the mail If you have any lab test that is abnormal or we need to change your treatment, we will call you to review the results.   Testing/Procedures: None Ordered   Follow-Up: At [facility], you and your health needs are our priority.  As part of our continuing mission to provide you with exceptional heart care, we have created designated Provider Care Teams.  These Care Teams include your primary Cardiologist (physician) and Advanced Practice Providers (APPs -  Physician Assistants and Nurse Practitioners) who all work together to provide you with the care you need, when you need  it.  We recommend signing up for the patient portal called "MyChart".  Sign up information is provided on this After Visit Summary.  MyChart is used to connect with patients for Virtual Visits (Telemedicine).  Patients are able to view lab/test results, encounter notes, upcoming appointments, etc.  Non-urgent messages can be sent to your provider as well.   To learn more about what you can do with MyChart, go to [email or web address].    Your next appointment:   Follow up as needed   The format for your next appointment:   In Person  Provider:   Kate Sable, MD   Other Instructions      Signed, Kate Sable, MD  04/17/2020 10:36 AM    [place]

## 2020-04-17 NOTE — Patient Instructions (Signed)

## 2020-04-21 ENCOUNTER — Ambulatory Visit: Payer: 59 | Admitting: Gastroenterology

## 2020-04-23 MED FILL — DIMETHYL FUMARATE 240 MG CP: 240 | 30 days supply | Qty: 60 | Fill #4

## 2020-04-27 ENCOUNTER — Ambulatory Visit: Payer: 59 | Admitting: Cardiology

## 2020-05-26 MED FILL — DIMETHYL FUMARATE 240 MG CP: 240 | 30 days supply | Qty: 60 | Fill #5

## 2020-06-02 ENCOUNTER — Telehealth: Payer: Self-pay | Admitting: Pharmacist

## 2020-06-02 NOTE — Telephone Encounter (Signed)
Called patient to schedule an appointment for the [employer] [facility]. I was unable to reach the patient so I left a HIPAA-compliant message requesting that the patient return my call.   Luke Van Ausdall, PharmD, BCACP, CPP Clinical Pharmacist [facility] [phone]  

## 2020-06-05 ENCOUNTER — Other Ambulatory Visit: Payer: Self-pay

## 2020-06-05 ENCOUNTER — Ambulatory Visit (HOSPITAL_BASED_OUTPATIENT_CLINIC_OR_DEPARTMENT_OTHER): Payer: 59 | Admitting: Pharmacist

## 2020-06-05 ENCOUNTER — Encounter: Payer: Self-pay | Admitting: Pharmacist

## 2020-06-05 DIAGNOSIS — Z79899 Other long term (current) drug therapy: Secondary | ICD-10-CM

## 2020-06-05 NOTE — Progress Notes (Signed)
S: Patient presents today for review of their specialty medication.   Patient is currently takingTecfidera(dimethyl fumarate) forMS. Patient is managed byDr.Potter for this.  Adherence:confirms. Does miss a dose sometimes but confirms good adherence overall.   Efficacy:pt is happy withhisresults  Dosing:240 mg BID  Renal adjustment: no adjustment necessary  Hepatic adjustment: no adjustment necessary  Dose adjustment for toxicity: Flushing, GI intolerance, or intolerance to maintenance dose: Consider temporary dose reduction to 120 mg twice daily (resume recommended maintenance dose of 240 mg twice daily within 4 weeks). Consider discontinuation in patients who cannot tolerate return to the maintenance dose.  Hepatic injury (suspected drug-induced), clinically significant: Discontinue treatment.  Lymphocyte count <500/mm3 persisting for >6 months: Consider treatment interruption.  Serious infection: Consider withholding treatment until infection resolves.  Current adverse effects: Flushing, skin rash, or puritus: occasionally presents, treats with ASA; pt is able to tolerate S/sx of infection:none GI upset:none S/sx ofheptotoxicity:none  O:  CMP     Component Value Date/Time   NA 138 02/11/2020 0748   NA 138 07/19/2012 1435   K 4.1 02/11/2020 0748   K 4.1 07/19/2012 1435   CL 103 02/11/2020 0748   CL 104 07/19/2012 1435   CO2 26 02/11/2020 0748   CO2 28 07/19/2012 1435   GLUCOSE 98 02/11/2020 0748   GLUCOSE 82 07/19/2012 1435   BUN 19 02/11/2020 0748   BUN 11 07/19/2012 1435   CREATININE 0.68 02/11/2020 0748   CREATININE 0.76 07/19/2012 1435   CALCIUM 9.1 02/11/2020 0748   CALCIUM 8.8 07/19/2012 1435   PROT 7.5 02/11/2020 0748   ALBUMIN 4.4 02/11/2020 0748   AST 17 02/11/2020 0748   ALT 18 02/11/2020 0748   ALKPHOS 65 02/11/2020 0748   BILITOT 1.1 02/11/2020 0748   GFRNONAA >60 02/11/2020 0748   GFRNONAA >60 07/19/2012 1435    GFRAA >60 02/11/2020 0748   GFRAA >60 07/19/2012 1435   CBC    Component Value Date/Time   WBC 4.3 02/11/2020 0748   RBC 3.61 (L) 02/11/2020 0748   HGB 11.6 (L) 02/11/2020 0748   HGB 11.8 (L) 10/28/2013 1128   HCT 33.1 (L) 02/11/2020 0748   HCT 34.5 (L) 10/28/2013 1128   PLT 239 02/11/2020 0748   PLT 243 10/28/2013 1128   MCV 91.7 02/11/2020 0748   MCV 92 10/28/2013 1128   MCH 32.1 02/11/2020 0748   MCHC 35.0 02/11/2020 0748   RDW 11.9 02/11/2020 0748   RDW 12.2 10/28/2013 1128   LYMPHSABS 1.2 02/11/2020 0748   LYMPHSABS 2.0 07/19/2012 1435   MONOABS 0.5 02/11/2020 0748   MONOABS 0.6 07/19/2012 1435   EOSABS 0.1 02/11/2020 0748   EOSABS 0.1 07/19/2012 1435   BASOSABS 0.0 02/11/2020 0748   BASOSABS 0.0 07/19/2012 1435   A/P:  A/P: 1. Medication review: Patient is currently onTecfideraforMSand is tolerating it well. Reviewed the medication with the patient, including the following:dimethyl fumarate activates the Nrf2 pathway, which is believed to result in anti-inflammatory and cytoprotective properties.Themedication is oral and should be swallowed whole. Administering with a high-fat, high-protein meal may decrease flushing and GI side effects.Possible adverse effects include skin flushing, pruritus, GI upset, albuminura, infection, lymphocytopenia, and increased liver transaminases.Cases of PML have been reported with severe, long-standing lymphopenia identified as the primary risk for PML. Dose adjustments for toxicities have been summarized above.No recommendations for any changes at this time.  Benard Halsted, PharmD, [place], [place] [phone]

## 2020-06-17 ENCOUNTER — Other Ambulatory Visit: Payer: Self-pay | Admitting: Neurology

## 2020-06-23 ENCOUNTER — Other Ambulatory Visit: Payer: Self-pay | Admitting: Pharmacist

## 2020-06-23 ENCOUNTER — Other Ambulatory Visit (HOSPITAL_COMMUNITY): Payer: Self-pay | Admitting: Internal Medicine

## 2020-06-23 MED ORDER — DIMETHYL FUMARATE 240 MG PO CPDR
240.0000 mg | DELAYED_RELEASE_CAPSULE | Freq: Two times a day (BID) | ORAL | 1 refills | Status: DC
Start: 2020-06-23 — End: 2020-09-01

## 2020-06-24 MED FILL — DIMETHYL FUMARATE 240 MG CP: 240 | 30 days supply | Qty: 60 | Fill #0

## 2020-07-07 ENCOUNTER — Other Ambulatory Visit: Payer: Self-pay | Admitting: Neurology

## 2020-07-07 DIAGNOSIS — G35 Multiple sclerosis: Secondary | ICD-10-CM

## 2020-07-08 ENCOUNTER — Other Ambulatory Visit: Payer: Self-pay

## 2020-07-08 ENCOUNTER — Ambulatory Visit
Admission: RE | Admit: 2020-07-08 | Discharge: 2020-07-08 | Disposition: A | Discharge status: Home / Self Care | Payer: 59 | Source: Ambulatory Visit | Attending: Neurology | Admitting: Neurology

## 2020-07-08 DIAGNOSIS — G35 Multiple sclerosis: Secondary | ICD-10-CM | POA: Insufficient documentation

## 2020-07-08 MED ORDER — GADOBUTROL 1 MMOL/ML IV SOLN
6.0000 mL | Freq: Once | INTRAVENOUS | Status: AC | PRN
  Administered 2020-07-08: 6 mL via INTRAVENOUS

## 2020-07-10 ENCOUNTER — Other Ambulatory Visit: Payer: Self-pay | Admitting: Neurology

## 2020-07-18 ENCOUNTER — Ambulatory Visit: Admission: RE | Admit: 2020-07-18 | Payer: 59 | Source: Ambulatory Visit

## 2020-07-30 ENCOUNTER — Other Ambulatory Visit (HOSPITAL_COMMUNITY): Payer: Self-pay | Admitting: Pharmacist

## 2020-08-03 DIAGNOSIS — G35 Multiple sclerosis: Secondary | ICD-10-CM | POA: Diagnosis not present

## 2020-08-03 DIAGNOSIS — M79671 Pain in right foot: Secondary | ICD-10-CM | POA: Diagnosis not present

## 2020-08-03 DIAGNOSIS — R208 Other disturbances of skin sensation: Secondary | ICD-10-CM | POA: Diagnosis not present

## 2020-08-07 ENCOUNTER — Other Ambulatory Visit (HOSPITAL_COMMUNITY): Payer: Self-pay

## 2020-08-27 ENCOUNTER — Other Ambulatory Visit (HOSPITAL_COMMUNITY): Payer: Self-pay

## 2020-08-31 ENCOUNTER — Other Ambulatory Visit (HOSPITAL_COMMUNITY): Payer: Self-pay

## 2020-09-01 ENCOUNTER — Other Ambulatory Visit (HOSPITAL_COMMUNITY): Payer: Self-pay

## 2020-09-01 ENCOUNTER — Other Ambulatory Visit: Payer: Self-pay | Admitting: Pharmacist

## 2020-09-01 MED ORDER — DIMETHYL FUMARATE 240 MG PO CPDR
DELAYED_RELEASE_CAPSULE | ORAL | 1 refills | Status: DC
Start: 2020-09-01 — End: 2020-09-01
  Filled 2020-09-01: qty 180, 90d supply, fill #0

## 2020-09-01 MED ORDER — DIMETHYL FUMARATE 240 MG PO CPDR
DELAYED_RELEASE_CAPSULE | ORAL | 1 refills | Status: DC
  Filled 2020-09-01 – 2020-09-04 (×3): qty 60, 30d supply, fill #0
  Filled 2020-09-28: qty 60, 30d supply, fill #1
  Filled 2020-10-27: qty 60, 30d supply, fill #2
  Filled 2020-11-24: qty 60, 30d supply, fill #3
  Filled 2020-12-23 – 2020-12-25 (×2): qty 60, 30d supply, fill #4
  Filled 2021-01-26: qty 60, 30d supply, fill #5

## 2020-09-03 ENCOUNTER — Other Ambulatory Visit (HOSPITAL_COMMUNITY): Payer: Self-pay

## 2020-09-04 ENCOUNTER — Other Ambulatory Visit (HOSPITAL_COMMUNITY): Payer: Self-pay

## 2020-09-08 ENCOUNTER — Other Ambulatory Visit (HOSPITAL_COMMUNITY): Payer: Self-pay

## 2020-09-28 ENCOUNTER — Other Ambulatory Visit (HOSPITAL_COMMUNITY): Payer: Self-pay

## 2020-10-03 ENCOUNTER — Other Ambulatory Visit (HOSPITAL_COMMUNITY): Payer: Self-pay

## 2020-10-05 ENCOUNTER — Other Ambulatory Visit (HOSPITAL_COMMUNITY): Payer: Self-pay

## 2020-10-27 ENCOUNTER — Other Ambulatory Visit (HOSPITAL_COMMUNITY): Payer: Self-pay

## 2020-11-02 ENCOUNTER — Other Ambulatory Visit (HOSPITAL_COMMUNITY): Payer: Self-pay

## 2020-11-17 ENCOUNTER — Encounter: Payer: Self-pay | Admitting: Internal Medicine

## 2020-11-17 NOTE — Telephone Encounter (Signed)
Please advise, Patient last seen 02/06/20 and scheduled for next physical 02/2021.   Okay to send in Integra rx?

## 2020-11-18 ENCOUNTER — Other Ambulatory Visit: Payer: Self-pay | Admitting: Internal Medicine

## 2020-11-18 ENCOUNTER — Other Ambulatory Visit: Payer: Self-pay

## 2020-11-18 DIAGNOSIS — D509 Iron deficiency anemia, unspecified: Secondary | ICD-10-CM

## 2020-11-18 MED ORDER — INTEGRA PLUS PO CAPS
1.0000 | ORAL_CAPSULE | Freq: Every day | ORAL | 3 refills | Status: AC
  Filled 2020-11-18: qty 90, 90d supply, fill #0
  Filled 2021-03-02: qty 90, 90d supply, fill #1

## 2020-11-19 ENCOUNTER — Other Ambulatory Visit: Payer: Self-pay

## 2020-11-24 ENCOUNTER — Other Ambulatory Visit (HOSPITAL_COMMUNITY): Payer: Self-pay

## 2020-11-26 ENCOUNTER — Other Ambulatory Visit (HOSPITAL_COMMUNITY): Payer: Self-pay

## 2020-12-02 ENCOUNTER — Other Ambulatory Visit (HOSPITAL_COMMUNITY): Payer: Self-pay

## 2020-12-21 ENCOUNTER — Other Ambulatory Visit: Payer: Self-pay

## 2020-12-21 ENCOUNTER — Encounter: Payer: Self-pay | Admitting: Emergency Medicine

## 2020-12-21 ENCOUNTER — Ambulatory Visit
Admission: EM | Admit: 2020-12-21 | Discharge: 2020-12-21 | Disposition: A | Discharge status: Home / Self Care | Payer: 59 | Attending: Emergency Medicine | Admitting: Emergency Medicine

## 2020-12-21 DIAGNOSIS — S80862A Insect bite (nonvenomous), left lower leg, initial encounter: Secondary | ICD-10-CM

## 2020-12-21 DIAGNOSIS — L03116 Cellulitis of left lower limb: Secondary | ICD-10-CM | POA: Diagnosis not present

## 2020-12-21 DIAGNOSIS — Z20822 Contact with and (suspected) exposure to covid-19: Secondary | ICD-10-CM | POA: Diagnosis not present

## 2020-12-21 DIAGNOSIS — W57XXXA Bitten or stung by nonvenomous insect and other nonvenomous arthropods, initial encounter: Secondary | ICD-10-CM

## 2020-12-21 MED ORDER — DOXYCYCLINE HYCLATE 100 MG PO CAPS
100.0000 mg | ORAL_CAPSULE | Freq: Two times a day (BID) | ORAL | 0 refills | Status: AC
  Filled 2020-12-21: qty 14, 7d supply, fill #0

## 2020-12-21 NOTE — ED Provider Notes (Signed)
Pam Hamilton    CSN: [id number] Arrival date & time: 12/21/20  1529      History   Chief Complaint Chief Complaint  Patient presents with   Insect Bite    HPI Pam Hamilton is a 55 y.o. female.  Patient presents with redness on her left lower leg x7 days.  She states the area originally started as 2 insect bites, though she did not see what bit her.  The redness got worse but then has gotten better in the last day or so.  She denies fever, chills, numbness, weakness, wound drainage, or other symptoms.  Treatment at home with peroxide and topical antibiotic ointment.  Her medical history includes multiple sclerosis.  The history is provided by the patient and medical records.   Past Medical History:  Diagnosis Date   Depression    1997   Endometrial polyp    Family history of breast cancer 03/2018   cancer genetic testing letter sent   Family history of ovarian cancer    Multiple sclerosis ([place])    Shingles    right upper arm ? year 2015 or prior    Urine incontinence 2015   2 Episodes     Patient Active Problem List   Diagnosis Date Noted   Prediabetes 01/16/2019   Anxiety 01/10/2019   Multiple sclerosis ([place]) 11/02/2017   Anemia 11/02/2017   Abnormal bleeding in menstrual cycle 01/17/2017   Menopausal hot flushes 01/17/2017   Annual physical exam    First degree hemorrhoids    Diverticulosis of large intestine without diverticulitis    GERD (gastroesophageal reflux disease) 09/26/2016   Vitamin B12 deficiency 08/17/2016   Fracture of multiple ribs 08/24/2015   Chronic pain in right foot 03/10/2015   Encounter to establish care 06/23/2014   Routine general medical examination at a health care facility 06/23/2014   Numbness and tingling of right arm 01/06/2014    Past Surgical History:  Procedure Laterality Date   BREAST CYST ASPIRATION     neg   CESAREAN SECTION     COLONOSCOPY WITH PROPOFOL N/A 11/18/2016   Procedure: COLONOSCOPY WITH  PROPOFOL;  Surgeon: Jonathon Bellows, MD;  Location: [facility] ENDOSCOPY;  Service: Endoscopy;  Laterality: N/A;   INTRAUTERINE DEVICE (IUD) INSERTION  01/02/2014   Mirena   OVARY SURGERY  6/252015   Right ovary removed.  Benign cyst.   POLYPECTOMY  2008,11/07/13   Removal of polyps from uterine. Vernie Ammons)   SALPINGECTOMY  11/07/2013   Right.Marland KitchenMarland KitchenHemorrhagic cyst   Sonohysterogram  05/07/2007   VanDalen    OB History     Gravida  2   Para  2   Term  2   Preterm      AB      Living  2      SAB      IAB      Ectopic      Multiple      Live Births               Home Medications    Prior to Admission medications   Medication Sig Start Date End Date Taking? Authorizing Provider  doxycycline (VIBRAMYCIN) 100 MG capsule Take 1 capsule (100 mg total) by mouth 2 (two) times daily for 7 days. 12/21/20 12/28/20 Yes Sharion Balloon, NP  Cholecalciferol (VITAMIN D) 2000 units CAPS Take 2,000 Units by mouth daily.     [provider]  COVID-19 At Home Antigen Test KIT USE AS  DIRECTE WITHIN PACKAGE INSTRUCTIONS 07/30/20 07/30/21  Nicks, Ewing Schlein, RPH  Dimethyl Fumarate 240 MG CPDR TAKE 1 CAPSULE (240 MG TOTAL) BY MOUTH 2 (TWO) TIMES DAILY. 09/01/20   Tresa Garter, MD  FeFum-FePoly-FA-B Cmp-C-Biot (INTEGRA PLUS) CAPS Take 1 capsule by mouth daily. 11/18/20   McLean-Scocuzza, Nino Glow, MD  gabapentin (NEURONTIN) 100 MG capsule TAKE 2 CAPSULES BY MOUTH 2 TIMES DAILY 07/10/20 07/10/21  Anabel Bene, MD  metoprolol tartrate (LOPRESSOR) 100 MG tablet TAKE 1 TABLET BY MOUTH ONCE FOR 1 DOSE. TAKE 2 HOURS PRIOR TO CT. 03/09/20 03/09/21  Kate Sable, MD  predniSONE (DELTASONE) 10 MG tablet TAKE 6 TABLETS BY MOUTH FOR 2 DAYS,THEN 5 TABS FOR 2 DAYS, 4 TABS FOR 2 DAYS,3 TABS FOR 2 DAYS, 2 TABS FOR 2 DAYS, 1 TAB FOR 2 DAYS 06/17/20 06/17/21  Anabel Bene, MD    Family History Family History  Problem Relation Age of Onset   Hyperlipidemia Mother    Hypertension Mother     Diabetes Mother    Osteoarthritis Mother    Hypothyroidism Mother    Alcohol abuse Father    Anuerysm Paternal Grandmother    Breast cancer Maternal Aunt 9   Breast cancer Cousin 73   Ovarian cancer Other 61    Social History Social History   Tobacco Use   Smoking status: Never   Smokeless tobacco: Never  Vaping Use   Vaping Use: Never used  Substance Use Topics   Alcohol use: Not Currently    Alcohol/week: 1.0 standard drink    Types: 1 Glasses of wine per week    Comment: Once a month    Drug use: No     Allergies   Ciprofloxacin and Septra [sulfamethoxazole-trimethoprim]   Review of Systems Review of Systems  Constitutional:  Negative for chills and fever.  Respiratory:  Negative for cough and shortness of breath.   Cardiovascular:  Negative for chest pain and palpitations.  Gastrointestinal:  Negative for abdominal pain and vomiting.  Musculoskeletal:  Negative for arthralgias, gait problem and joint swelling.  Skin:  Positive for color change and wound.  Neurological:  Negative for weakness and numbness.  All other systems reviewed and are negative.   Physical Exam Triage Vital Signs ED Triage Vitals  Enc Vitals Group     BP 12/21/20 1549 126/84     Pulse Rate 12/21/20 1549 (!) 112     Resp 12/21/20 1549 18     Temp 12/21/20 1549 99.6 F (37.6 C)     Temp Source 12/21/20 1549 Oral     SpO2 12/21/20 1549 96 %     Weight --      Height --      Head Circumference --      Peak Flow --      Pain Score 12/21/20 1552 0     Pain Loc --      Pain Edu? --      Excl. in [place]? --    No data found.  Updated Vital Signs BP 126/84 (BP Location: Left Arm)   Pulse (!) 112   Temp 99.6 F (37.6 C) (Oral)   Resp 18   LMP 01/26/2018   SpO2 96%   Visual Acuity Right Eye Distance:   Left Eye Distance:   Bilateral Distance:    Right Eye Near:   Left Eye Near:    Bilateral Near:     Physical Exam Vitals and nursing note reviewed.  Constitutional:  General: She is not in acute distress.    Appearance: She is well-developed. She is not ill-appearing.  HENT:     Head: Normocephalic and atraumatic.     Mouth/Throat:     Mouth: Mucous membranes are moist.  Eyes:     Conjunctiva/sclera: Conjunctivae normal.  Cardiovascular:     Rate and Rhythm: Normal rate and regular rhythm.     Heart sounds: No murmur heard. Pulmonary:     Effort: Pulmonary effort is normal. No respiratory distress.     Breath sounds: Normal breath sounds.  Abdominal:     Palpations: Abdomen is soft.     Tenderness: There is no abdominal tenderness.  Musculoskeletal:        General: No swelling or deformity. Normal range of motion.     Cervical back: Neck supple.  Skin:    General: Skin is warm and dry.     Capillary Refill: Capillary refill takes less than 2 seconds.     Findings: Erythema and lesion present.  Neurological:     General: No focal deficit present.     Mental Status: She is alert and oriented to person, place, and time.     Sensory: No sensory deficit.     Motor: No weakness.     Gait: Gait normal.  Psychiatric:        Mood and Affect: Mood normal.        Behavior: Behavior normal.      UC Treatments / Results  Labs (all labs ordered are listed, but only abnormal results are displayed) Labs Reviewed - No data to display  EKG   Radiology No results found.  Procedures Procedures (including critical care time)  Medications Ordered in UC Medications - No data to display  Initial Impression / Assessment and Plan / UC Course  I have reviewed the triage vital signs and the nursing notes.  Pertinent labs & imaging results that were available during my care of the patient were reviewed by me and considered in my medical decision making (see chart for details).  Cellulitis of left lower leg due to insect bites.  Treating with doxycycline.  Wound care instructions and signs of worsening infection discussed.  Education provided on  cellulitis.  ED precautions discussed.  Instructed patient to follow-up with her PCP in 1 week for a recheck of the area.  She agrees to plan of care.   Final Clinical Impressions(s) / UC Diagnoses   Final diagnoses:  Cellulitis of left lower leg  Insect bite of left lower leg, initial encounter     Discharge Instructions      Take the doxycycline as directed.  To the emergency department if you have signs of worsening infection, such as increased redness or fever.    Follow up with your primary care provider in 1 week for a recheck.           ED Prescriptions     Medication Sig Dispense Auth. Provider   doxycycline (VIBRAMYCIN) 100 MG capsule Take 1 capsule (100 mg total) by mouth 2 (two) times daily for 7 days. 14 capsule Sharion Balloon, NP      PDMP not reviewed this encounter.   Sharion Balloon, NP 12/21/20 [phone]

## 2020-12-21 NOTE — Discharge Instructions (Addendum)
Take the doxycycline as directed.  To the emergency department if you have signs of worsening infection, such as increased redness or fever.    Follow up with your primary care provider in 1 week for a recheck.

## 2020-12-21 NOTE — ED Triage Notes (Signed)
Pt said last Tuesday  was bitten by something and that next morning her body was red and itchy. Pt said she did noticed a red line that ran up into her groin area. There were two different bites with two holes per bite. Was very swollen and red, but has gotten better per patient. The red streak is getting better also.

## 2020-12-23 ENCOUNTER — Telehealth: Payer: 59 | Admitting: Family Medicine

## 2020-12-23 ENCOUNTER — Other Ambulatory Visit: Payer: Self-pay

## 2020-12-23 ENCOUNTER — Other Ambulatory Visit (HOSPITAL_COMMUNITY): Payer: Self-pay

## 2020-12-23 DIAGNOSIS — R21 Rash and other nonspecific skin eruption: Secondary | ICD-10-CM | POA: Diagnosis not present

## 2020-12-23 LAB — NOVEL CORONAVIRUS, NAA: SARS-CoV-2, NAA: NOT DETECTED

## 2020-12-23 LAB — SARS-COV-2, NAA 2 DAY TAT

## 2020-12-23 MED ORDER — PREDNISONE 10 MG PO TABS
ORAL_TABLET | ORAL | 0 refills | Status: DC
  Filled 2020-12-23: qty 21, 6d supply, fill #0

## 2020-12-23 NOTE — Progress Notes (Signed)
E Visit for Rash  We are sorry that you are not feeling well. Here is how we plan to help!  Based on what you shared with me it looks like you have contact dermatitis.  Contact dermatitis is a skin rash caused by something that touches the skin and causes irritation or inflammation.  Your skin may be red, swollen, dry, cracked, and itch.  The rash should go away in a few days but can last a few weeks.  If you get a rash, it's important to figure out what caused it so the irritant can be avoided in the future.  Based on what you shared with me you may have a virus or an allergic reaction.  Avoid contact with pregnant women until a diagnosis is made.  Most viral rashes are contagious (especially if a fever is present).  You can return to work or school after the rash is gone or when your doctor says it is safe to return with the rash.    Prednisone 10 mg daily for 6 days (see taper instructions below)  Directions for 6 day taper: Day 1: 2 tablets before breakfast, 1 after both lunch & dinner and 2 at bedtime Day 2: 1 tab before breakfast, 1 after both lunch & dinner and 2 at bedtime Day 3: 1 tab at each meal & 1 at bedtime Day 4: 1 tab at breakfast, 1 at lunch, 1 at bedtime Day 5: 1 tab at breakfast & 1 tab at bedtime Day 6: 1 tab at breakfast    HOME CARE:  Take cool showers and avoid direct sunlight. Apply cool compress or wet dressings. Take a bath in an oatmeal bath.  Sprinkle content of one Aveeno packet under running faucet with comfortably warm water.  Bathe for 15-20 minutes, 1-2 times daily.  Pat dry with a towel. Do not rub the rash. Use hydrocortisone cream. Take an antihistamine like Benadryl for widespread rashes that itch.  The adult dose of Benadryl is 25-50 mg by mouth 4 times daily. Caution:  This type of medication may cause sleepiness.  Do not drink alcohol, drive, or operate dangerous machinery while taking antihistamines.  Do not take these medications if you have  prostate enlargement.  Read package instructions thoroughly on all medications that you take.  GET HELP RIGHT AWAY IF:  Symptoms don't go away after treatment. Severe itching that persists. If you rash spreads or swells. If you rash begins to smell. If it blisters and opens or develops a yellow-brown crust. You develop a fever. You have a sore throat. You become short of breath.  MAKE SURE YOU:  Understand these instructions. Will watch your condition. Will get help right away if you are not doing well or get worse.  Thank you for choosing an e-visit.  Your e-visit answers were reviewed by a board certified advanced clinical practitioner to complete your personal care plan. Depending upon the condition, your plan could have included both over the counter or prescription medications.  Please review your pharmacy choice. Make sure the pharmacy is open so you can pick up prescription now. If there is a problem, you may contact your provider through [employer] and have the prescription routed to another pharmacy.  Your safety is important to [place]. If you have drug allergies check your prescription carefully.   For the next 24 hours you can use MyChart to ask questions about today's visit, request a non-urgent call back, or ask for a work or school  excuse. You will get an email in the next two days asking about your experience. I hope that your e-visit has been valuable and will speed your recovery.   I provided 5 minutes of non face-to-face time during this encounter for chart review, medication and order placement, as well as and documentation.

## 2020-12-24 ENCOUNTER — Encounter: Payer: Self-pay | Admitting: Internal Medicine

## 2020-12-25 ENCOUNTER — Other Ambulatory Visit (HOSPITAL_COMMUNITY): Payer: Self-pay

## 2020-12-28 ENCOUNTER — Other Ambulatory Visit (HOSPITAL_COMMUNITY): Payer: Self-pay

## 2020-12-30 ENCOUNTER — Other Ambulatory Visit (HOSPITAL_COMMUNITY): Payer: Self-pay

## 2021-01-26 ENCOUNTER — Other Ambulatory Visit (HOSPITAL_COMMUNITY): Payer: Self-pay

## 2021-01-28 ENCOUNTER — Other Ambulatory Visit (HOSPITAL_COMMUNITY): Payer: Self-pay

## 2021-02-01 ENCOUNTER — Other Ambulatory Visit: Payer: Self-pay | Admitting: Pharmacist

## 2021-02-01 ENCOUNTER — Other Ambulatory Visit: Payer: Self-pay

## 2021-02-01 ENCOUNTER — Other Ambulatory Visit (HOSPITAL_COMMUNITY): Payer: Self-pay

## 2021-02-01 DIAGNOSIS — R200 Anesthesia of skin: Secondary | ICD-10-CM | POA: Diagnosis not present

## 2021-02-01 DIAGNOSIS — R202 Paresthesia of skin: Secondary | ICD-10-CM | POA: Diagnosis not present

## 2021-02-01 DIAGNOSIS — Z79899 Other long term (current) drug therapy: Secondary | ICD-10-CM | POA: Diagnosis not present

## 2021-02-01 DIAGNOSIS — G35 Multiple sclerosis: Secondary | ICD-10-CM | POA: Diagnosis not present

## 2021-02-01 MED ORDER — DIMETHYL FUMARATE 240 MG PO CPDR
DELAYED_RELEASE_CAPSULE | ORAL | 1 refills | Status: DC
  Filled 2021-02-01: qty 180, 90d supply, fill #0

## 2021-02-01 MED ORDER — DIMETHYL FUMARATE 240 MG PO CPDR
DELAYED_RELEASE_CAPSULE | ORAL | 1 refills | Status: DC
  Filled 2021-02-01: qty 180, fill #0
  Filled 2021-02-23: qty 60, 30d supply, fill #0
  Filled 2021-03-29: qty 60, 30d supply, fill #1
  Filled 2021-05-03: qty 60, 30d supply, fill #2
  Filled 2021-06-09: qty 60, 30d supply, fill #3
  Filled 2021-07-09: qty 60, 30d supply, fill #4
  Filled 2021-08-17: qty 60, 30d supply, fill #5

## 2021-02-02 ENCOUNTER — Other Ambulatory Visit (HOSPITAL_COMMUNITY): Payer: Self-pay

## 2021-02-05 ENCOUNTER — Encounter: Payer: 59 | Admitting: Internal Medicine

## 2021-02-23 ENCOUNTER — Other Ambulatory Visit (HOSPITAL_COMMUNITY): Payer: Self-pay

## 2021-02-25 ENCOUNTER — Encounter: Payer: 59 | Admitting: Internal Medicine

## 2021-03-01 ENCOUNTER — Other Ambulatory Visit (HOSPITAL_COMMUNITY): Payer: Self-pay

## 2021-03-02 ENCOUNTER — Other Ambulatory Visit: Payer: Self-pay

## 2021-03-03 ENCOUNTER — Other Ambulatory Visit: Payer: Self-pay

## 2021-03-19 ENCOUNTER — Encounter: Payer: 59 | Admitting: Internal Medicine

## 2021-03-25 ENCOUNTER — Other Ambulatory Visit: Payer: Self-pay

## 2021-03-25 ENCOUNTER — Ambulatory Visit (INDEPENDENT_AMBULATORY_CARE_PROVIDER_SITE_OTHER): Payer: 59 | Admitting: Internal Medicine

## 2021-03-25 ENCOUNTER — Encounter: Payer: Self-pay | Admitting: Internal Medicine

## 2021-03-25 VITALS — BP 130/86 | HR 79 | Temp 96.1°F | Ht 65.98 in | Wt 155.2 lb

## 2021-03-25 DIAGNOSIS — Z1231 Encounter for screening mammogram for malignant neoplasm of breast: Secondary | ICD-10-CM | POA: Diagnosis not present

## 2021-03-25 DIAGNOSIS — Z1389 Encounter for screening for other disorder: Secondary | ICD-10-CM | POA: Diagnosis not present

## 2021-03-25 DIAGNOSIS — Z1329 Encounter for screening for other suspected endocrine disorder: Secondary | ICD-10-CM | POA: Diagnosis not present

## 2021-03-25 DIAGNOSIS — E559 Vitamin D deficiency, unspecified: Secondary | ICD-10-CM

## 2021-03-25 DIAGNOSIS — Z0000 Encounter for general adult medical examination without abnormal findings: Secondary | ICD-10-CM | POA: Diagnosis not present

## 2021-03-25 DIAGNOSIS — Z13220 Encounter for screening for lipoid disorders: Secondary | ICD-10-CM | POA: Diagnosis not present

## 2021-03-25 NOTE — Progress Notes (Signed)
Chief Complaint  Patient presents with   Annual Exam    Pt has OBGYN Pt asks about Integra Plus medication Pt wanted to go over recent Neurology Appt/Labs   Annual  1. Mammogram due  2. Had 02/01/21 neurology cmet, cbc, vit d 60.6, b12 851, tsh 3.274 consider lipid last in 2021 normal will rec do next labs  3. MS stable Doing well retired and now taking care of ada only grand child   Review of Systems  Constitutional:  Negative for weight loss.  HENT:  Negative for hearing loss.   Eyes:  Negative for blurred vision.  Respiratory:  Negative for shortness of breath.   Cardiovascular:  Negative for chest pain.  Gastrointestinal:  Negative for abdominal pain and blood in stool.  Genitourinary:  Negative for dysuria.  Musculoskeletal:  Negative for falls and joint pain.  Skin:  Negative for rash.  Neurological:  Negative for headaches.  Psychiatric/Behavioral:  Negative for depression.   Past Medical History:  Diagnosis Date   Depression    1997   Endometrial polyp    Family history of breast cancer 03/2018   cancer genetic testing letter sent   Family history of ovarian cancer    Multiple sclerosis ([place])    Shingles    right upper arm ? year 2015 or prior    Urine incontinence 2015   2 Episodes    Past Surgical History:  Procedure Laterality Date   BREAST CYST ASPIRATION     neg   CESAREAN SECTION     COLONOSCOPY WITH PROPOFOL N/A 11/18/2016   Procedure: COLONOSCOPY WITH PROPOFOL;  Surgeon: Jonathon Bellows, MD;  Location: [facility] ENDOSCOPY;  Service: Endoscopy;  Laterality: N/A;   INTRAUTERINE DEVICE (IUD) INSERTION  01/02/2014   Mirena   OVARY SURGERY  6/252015   Right ovary removed.  Benign cyst.   POLYPECTOMY  2008,11/07/13   Removal of polyps from uterine. Vernie Ammons)   SALPINGECTOMY  11/07/2013   Right.Marland KitchenMarland KitchenHemorrhagic cyst   Sonohysterogram  05/07/2007   [facility]   Family History  Problem Relation Age of Onset   Hyperlipidemia Mother    Hypertension Mother    Diabetes  Mother    Osteoarthritis Mother    Hypothyroidism Mother    Alcohol abuse Father    Anuerysm Paternal Grandmother    Breast cancer Maternal Aunt 44   Breast cancer Cousin 77   Ovarian cancer Other 71   Social History   Socioeconomic History   Marital status: Married    Spouse name: Not on file   Number of children: Not on file   Years of education: Not on file   Highest education level: Not on file  Occupational History   Not on file  Tobacco Use   Smoking status: Never   Smokeless tobacco: Never  Vaping Use   Vaping Use: Never used  Substance and Sexual Activity   Alcohol use: Not Currently    Alcohol/week: 1.0 standard drink    Types: 1 Glasses of wine per week    Comment: Once a month    Drug use: No   Sexual activity: Yes    Birth control/protection: None  Other Topics Concern   Not on file  Social History Narrative   Lives in [place] with Husband   2 children 20 and 17 both boys   1 Cat and 1 Rabbit   [employer]   Enjoys attending church, being with her boys       Social Determinants  of Health   Financial Resource Strain: Not on file  Food Insecurity: Not on file  Transportation Needs: Not on file  Physical Activity: Not on file  Stress: Not on file  Social Connections: Not on file  Intimate Partner Violence: Not on file   Current Meds  Medication Sig   Cholecalciferol (VITAMIN D) 2000 units CAPS Take 2,000 Units by mouth daily.    Dimethyl Fumarate 240 MG CPDR TAKE 1 CAPSULE (240 MG TOTAL) BY MOUTH 2 (TWO) TIMES DAILY.   FeFum-FePoly-FA-B Cmp-C-Biot (INTEGRA PLUS) CAPS Take 1 capsule by mouth daily.   gabapentin (NEURONTIN) 100 MG capsule TAKE 2 CAPSULES BY MOUTH 2 TIMES DAILY   omeprazole (PRILOSEC) 10 MG capsule Take by mouth.   Allergies  Allergen Reactions   Ciprofloxacin Swelling    Cipro   Septra [Sulfamethoxazole-Trimethoprim] Swelling   No results found for this or any previous visit (from the past 2160  hour(s)). Objective  Body mass index is 25.06 kg/m. Wt Readings from Last 3 Encounters:  03/25/21 155 lb 3.2 oz (70.4 kg)  04/17/20 152 lb (68.9 kg)  03/09/20 152 lb 8 oz (69.2 kg)   Temp Readings from Last 3 Encounters:  03/25/21 (!) 96.1 F (35.6 C)  12/21/20 99.6 F (37.6 C) (Oral)  02/06/20 98.4 F (36.9 C) (Oral)   BP Readings from Last 3 Encounters:  03/25/21 130/86  12/21/20 126/84  04/17/20 116/70   Pulse Readings from Last 3 Encounters:  03/25/21 79  12/21/20 (!) 112  04/17/20 (!) 112    Physical Exam Vitals and nursing note reviewed.  Constitutional:      Appearance: Normal appearance. She is well-developed and well-groomed.  HENT:     Head: Normocephalic and atraumatic.  Eyes:     Conjunctiva/sclera: Conjunctivae normal.     Pupils: Pupils are equal, round, and reactive to light.  Cardiovascular:     Rate and Rhythm: Normal rate and regular rhythm.     Heart sounds: Normal heart sounds. No murmur heard. Pulmonary:     Effort: Pulmonary effort is normal.     Breath sounds: Normal breath sounds.  Abdominal:     General: Abdomen is flat. Bowel sounds are normal.     Tenderness: There is no abdominal tenderness.  Musculoskeletal:        General: No tenderness.  Skin:    General: Skin is warm and dry.  Neurological:     General: No focal deficit present.     Mental Status: She is alert and oriented to person, place, and time. Mental status is at baseline.     Cranial Nerves: Cranial nerves 2-12 are intact.     Gait: Gait is intact.  Psychiatric:        Attention and Perception: Attention and perception normal.        Mood and Affect: Mood and affect normal.        Speech: Speech normal.        Behavior: Behavior normal. Behavior is cooperative.        Thought Content: Thought content normal.        Cognition and Memory: Cognition and memory normal.        Judgment: Judgment normal.    Assessment  Plan  Annual physical exam Labs 02/01/21 neuro  see care everywhere reviewd normal vit D 60.6  Had flu shot 02/2021 Tdap utd  Consider prevnar Pfizer 4/4 consider 5th dose  Disc shingrix again  - and given info pt wants to  think about h/o shingles in past disc DDI with MS medication will check with Dr. Melrose Nakayama    Per pt MMR titer checked in the past and immune.     Pap and genetic testing need to get records [facility] 03/15/18 pap negative    mammo neg 10/11/18 negative 11/14/19 negative  Ordered   Colonoscopy 11/18/16 diverticulosis/IH f/u due in 10 years   No need for dermatology referral at this time    Eye MD contacts wears glasses w/in 6 months of 02/06/20    Provider: Dr. Olivia Mackie McLean-Scocuzza-[facility]

## 2021-03-25 NOTE — Patient Instructions (Addendum)
Adequacy Satisfactory for evaluation  endocervical/transformation zone component PRESENT.   Diagnosis NEGATIVE FOR INTRAEPITHELIAL LESIONS OR MALIGNANCY.   HPV NOT DETECTED   Comment: Normal Reference Range - NOT Detected  Material Submitted CervicoVaginal Pap [ThinPrep Imaged]   Resulting Agency [place]  Consider 5th pfizer dose bivalent/omicron    Call to schedule mammogram   Call Dr. Georgianne Fick for pap smear   Consider shingrix vaccine x 2 doses   Also prevnar 13 vaccine pneumonia prevention    Pneumococcal Conjugate Vaccine (Prevnar 13) Suspension for Injection What is this medication? PNEUMOCOCCAL VACCINE (NEU mo KOK al vak SEEN) is a vaccine used to prevent pneumococcus bacterial infections. These bacteria can cause serious infections like pneumonia, meningitis, and blood infections. This vaccine will lower your chance of getting pneumonia. If you do get pneumonia, it can make your symptoms milder and your illness shorter. This vaccine will not treat an infection and will not cause infection. This vaccine is recommended for infants and young children, adults with certain medical conditions, and adults 18 years or older. This medicine may be used for other purposes; ask your health care provider or pharmacist if you have questions. COMMON BRAND NAME(S): Prevnar, Prevnar 13 What should I tell my care team before I take this medication? They need to know if you have any of these conditions: bleeding problems fever immune system problems an unusual or allergic reaction to pneumococcal vaccine, diphtheria toxoid, other vaccines, latex, other medicines, foods, dyes, or preservatives pregnant or trying to get pregnant breast-feeding How should I use this medication? This vaccine is for injection into a muscle. It is given by a health care professional. A copy of Vaccine Information Statements will be given before each vaccination. Read this sheet carefully each time. The sheet may  change frequently. Talk to your pediatrician regarding the use of this medicine in children. While this drug may be prescribed for children as young as 41 weeks old for selected conditions, precautions do apply. Overdosage: If you think you have taken too much of this medicine contact a poison control center or emergency room at once. NOTE: This medicine is only for you. Do not share this medicine with others. What if I miss a dose? It is important not to miss your dose. Call your doctor or health care professional if you are unable to keep an appointment. What may interact with this medication? medicines for cancer chemotherapy medicines that suppress your immune function steroid medicines like prednisone or cortisone This list may not describe all possible interactions. Give your health care provider a list of all the medicines, herbs, non-prescription drugs, or dietary supplements you use. Also tell them if you smoke, drink alcohol, or use illegal drugs. Some items may interact with your medicine. What should I watch for while using this medication? Mild fever and pain should go away in 3 days or less. Report any unusual symptoms to your doctor or health care professional. What side effects may I notice from receiving this medication? Side effects that you should report to your doctor or health care professional as soon as possible: allergic reactions like skin rash, itching or hives, swelling of the face, lips, or tongue breathing problems confused fast or irregular heartbeat fever over 102 degrees F seizures unusual bleeding or bruising unusual muscle weakness Side effects that usually do not require medical attention (report to your doctor or health care professional if they continue or are bothersome): aches and pains diarrhea fever of 102 degrees F or  less headache irritable loss of appetite pain, tender at site where injected trouble sleeping This list may not describe all  possible side effects. Call your doctor for medical advice about side effects. You may report side effects to FDA at 1-800-FDA-1088. Where should I keep my medication? This does not apply. This vaccine is given in a clinic, pharmacy, doctor's office, or other health care setting and will not be stored at home. NOTE: This sheet is a summary. It may not cover all possible information. If you have questions about this medicine, talk to your doctor, pharmacist, or health care provider.  2022 Elsevier/Gold Standard (2014-02-06 00:00:00)  Zoster Vaccine, Recombinant injection What is this medication? ZOSTER VACCINE (ZOS ter vak SEEN) is a vaccine used to reduce the risk of getting shingles. This vaccine is not used to treat shingles or nerve pain from shingles. This medicine may be used for other purposes; ask your health care provider or pharmacist if you have questions. COMMON BRAND NAME(S): [facility] What should I tell my care team before I take this medication? They need to know if you have any of these conditions: cancer immune system problems an unusual or allergic reaction to Zoster vaccine, other medications, foods, dyes, or preservatives pregnant or trying to get pregnant breast-feeding How should I use this medication? This vaccine is injected into a muscle. It is given by a health care provider. A copy of Vaccine Information Statements will be given before each vaccination. Be sure to read this information carefully each time. This sheet may change often. Talk to your health care provider about the use of this vaccine in children. This vaccine is not approved for use in children. Overdosage: If you think you have taken too much of this medicine contact a poison control center or emergency room at once. NOTE: This medicine is only for you. Do not share this medicine with others. What if I miss a dose? Keep appointments for follow-up (booster) doses. It is important not to miss your dose.  Call your health care provider if you are unable to keep an appointment. What may interact with this medication? medicines that suppress your immune system medicines to treat cancer steroid medicines like prednisone or cortisone This list may not describe all possible interactions. Give your health care provider a list of all the medicines, herbs, non-prescription drugs, or dietary supplements you use. Also tell them if you smoke, drink alcohol, or use illegal drugs. Some items may interact with your medicine. What should I watch for while using this medication? Visit your health care provider regularly. This vaccine, like all vaccines, may not fully protect everyone. What side effects may I notice from receiving this medication? Side effects that you should report to your doctor or health care professional as soon as possible: allergic reactions (skin rash, itching or hives; swelling of the face, lips, or tongue) trouble breathing Side effects that usually do not require medical attention (report these to your doctor or health care professional if they continue or are bothersome): chills headache fever nausea pain, redness, or irritation at site where injected tiredness vomiting This list may not describe all possible side effects. Call your doctor for medical advice about side effects. You may report side effects to FDA at 1-800-FDA-1088. Where should I keep my medication? This vaccine is only given by a health care provider. It will not be stored at home. NOTE: This sheet is a summary. It may not cover all possible information. If you have questions  about this medicine, talk to your doctor, pharmacist, or health care provider.  2022 Elsevier/Gold Standard (2021-01-19 00:00:00)

## 2021-03-26 ENCOUNTER — Other Ambulatory Visit (HOSPITAL_COMMUNITY): Payer: Self-pay

## 2021-03-29 ENCOUNTER — Other Ambulatory Visit (HOSPITAL_COMMUNITY): Payer: Self-pay

## 2021-04-05 ENCOUNTER — Other Ambulatory Visit (HOSPITAL_COMMUNITY): Payer: Self-pay

## 2021-04-20 IMAGING — CT CT HEART MORP W/ CTA COR W/ SCORE W/ CA W/CM &/OR W/O CM
2 of 11 series · 7 of 20 positions shown, 8 images · non-contrast
Comparison: CT chest 08/18/2015

Addendum:
CLINICAL DATA: chestpain

EXAM:
Cardiac/Coronary  CTA
TECHNIQUE: The patient was scanned on a Siemens Somatoform go.Top scanner.

[Series 27: multiphase % cta coronary 0.60 · axial · 0.29mm/px · z∈[-1107,-1047]mm · 3 of 2135 slices shown]
[im 534/2135  vessel]
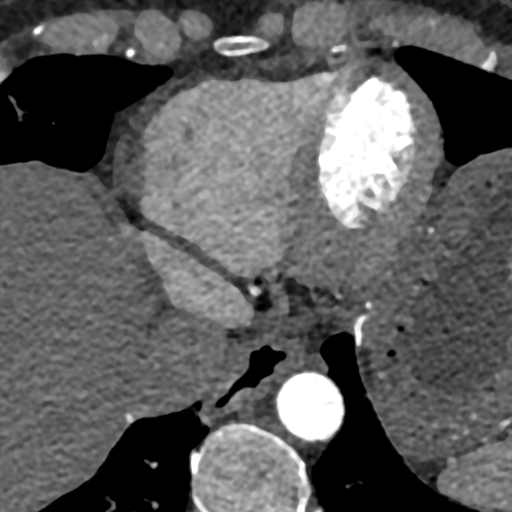
[im 1068/2135  vessel]
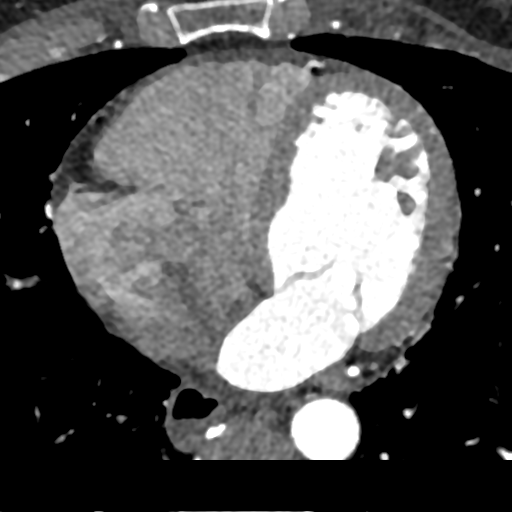
[im 1601/2135  vessel]
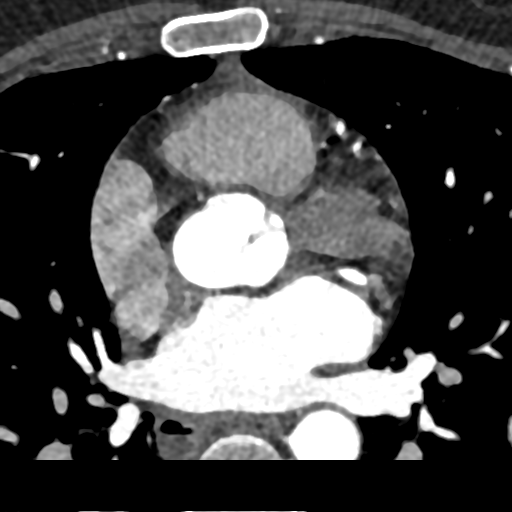

[Series 45: ms multiphase cta coronary 0.60 · axial · 0.29mm/px · z∈[-1114,-1041]mm · 4 of 2440 slices shown, 5 images]
[im 488/2440  vessel]
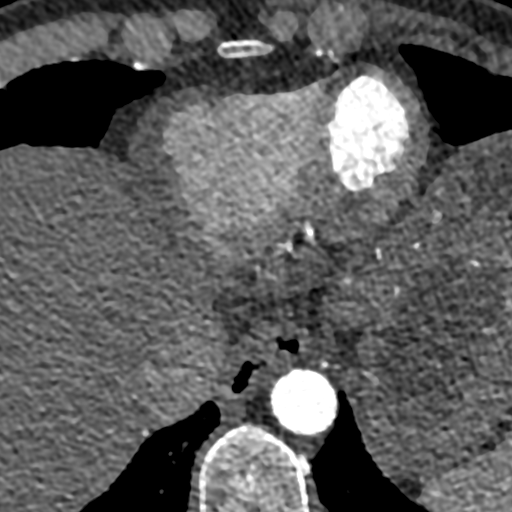
[im 488/2440  lung]
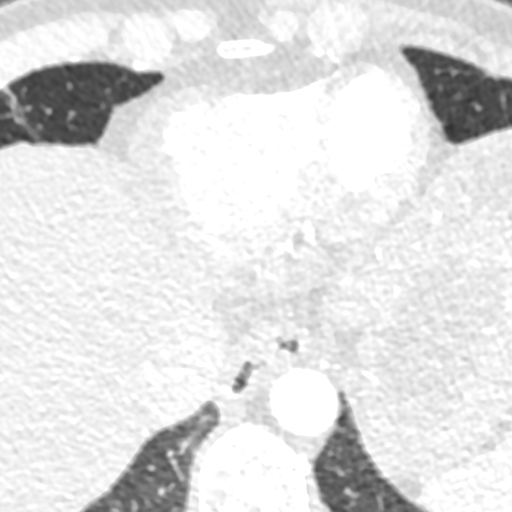
[im 976/2440  vessel]
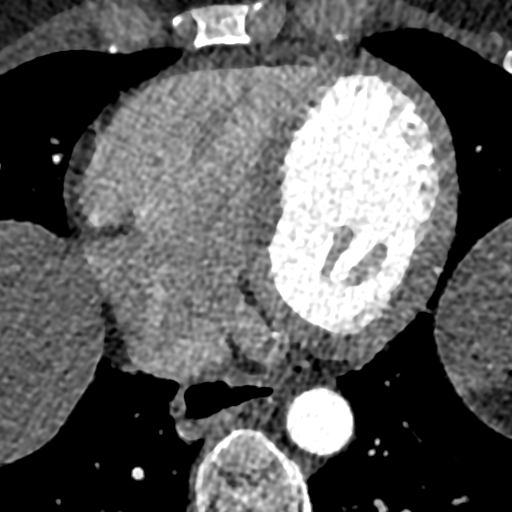
[im 1464/2440  vessel]
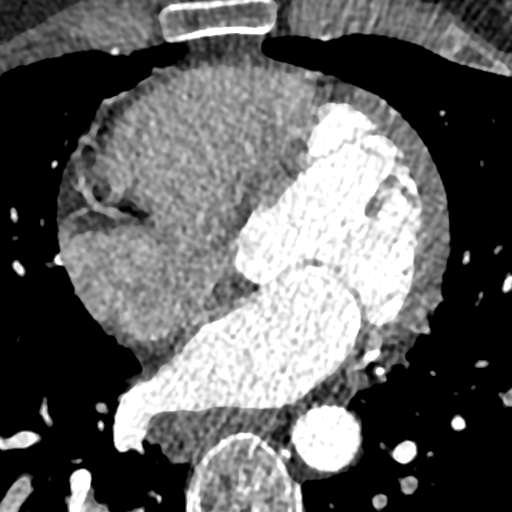
[im 1952/2440  vessel]
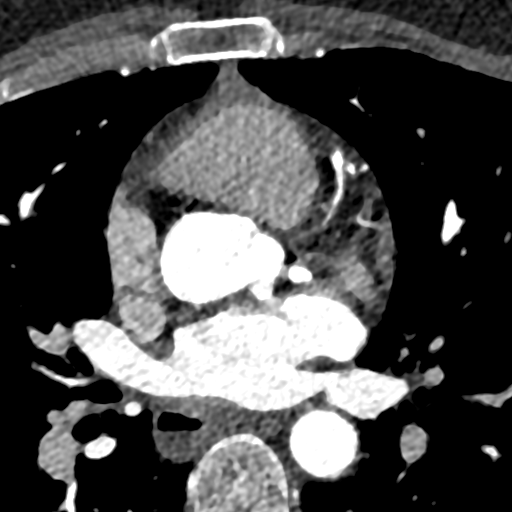

[7 of 20 positions shown; findings below may reference images not displayed]

FINDINGS: A retrospective scan was triggered in the descending thoracic aorta.
Axial non-contrast 3 mm slices were carried out through the heart.
The data set was analyzed on a dedicated work station and scored
using the Agatson method. Gantry rotation speed was 330 msecs and
collimation was .6 mm. 100mg of metoprolol and 0.8 mg of sl NTG was
given. The 3D data set was reconstructed in 5% intervals of the
55-95 % of the R-R cycle. Diastolic phases were analyzed on a
dedicated work station using MPR, MIP and VRT modes. The patient
received 75 cc of contrast.

Aorta:  Normal size.  No calcifications.  No dissection.

Aortic Valve:  Trileaflet.  No calcifications.

Coronary Arteries:  Normal coronary origin.  Left dominance.

RCA is a small non-dominant artery.  There is no plaque.

Left main is a large artery that gives rise to LAD and LCX arteries.

LAD is a large vessel, giving rise to a large diagonal branch. There
is no plaque.

LCX is a dominant artery that gives rise to two obtuse marginal
branches. There is no plaque.

Other findings:

Normal pulmonary vein drainage into the left atrium.

Normal left atrial appendage without a thrombus.

Normal size of the pulmonary artery.
IMPRESSION: 1. Coronary calcium score of 0. Patient is low risk for near term
coronary events

2. Normal coronary origin with right dominance.

3. No evidence of CAD.

4. CAD-RADS 0. Consider non-atherosclerotic causes of chest pain.

EXAM:
OVER-READ INTERPRETATION  CT CHEST

The following report is an over-read performed by radiologist Dr.
does not include interpretation of cardiac or coronary anatomy or
pathology. The coronary CTA interpretation by the cardiologist is
attached.
FINDINGS: Visualized thoracic aorta has normal caliber without atherosclerotic
calcifications. No significant pericardial fluid. Visualized
mediastinal structures are unremarkable. Visualized upper abdominal
structures are within normal limits. Dependent atelectasis in the
lower lobes. No pleural effusions. Visualized bone structures are
unremarkable. Mild dilatation of the lower esophagus is similar to
the prior examination.
IMPRESSION: Negative over-read examination.

*** End of Addendum ***
FINDINGS: A retrospective scan was triggered in the descending thoracic aorta.
Axial non-contrast 3 mm slices were carried out through the heart.
The data set was analyzed on a dedicated work station and scored
using the Agatson method. Gantry rotation speed was 330 msecs and
collimation was .6 mm. 100mg of metoprolol and 0.8 mg of sl NTG was
given. The 3D data set was reconstructed in 5% intervals of the
55-95 % of the R-R cycle. Diastolic phases were analyzed on a
dedicated work station using MPR, MIP and VRT modes. The patient
received 75 cc of contrast.

Aorta:  Normal size.  No calcifications.  No dissection.

Aortic Valve:  Trileaflet.  No calcifications.

Coronary Arteries:  Normal coronary origin.  Left dominance.

RCA is a small non-dominant artery.  There is no plaque.

Left main is a large artery that gives rise to LAD and LCX arteries.

LAD is a large vessel, giving rise to a large diagonal branch. There
is no plaque.

LCX is a dominant artery that gives rise to two obtuse marginal
branches. There is no plaque.

Other findings:

Normal pulmonary vein drainage into the left atrium.

Normal left atrial appendage without a thrombus.

Normal size of the pulmonary artery.
IMPRESSION: 1. Coronary calcium score of 0. Patient is low risk for near term
coronary events

2. Normal coronary origin with right dominance.

3. No evidence of CAD.

4. CAD-RADS 0. Consider non-atherosclerotic causes of chest pain.

## 2021-05-03 ENCOUNTER — Other Ambulatory Visit (HOSPITAL_COMMUNITY): Payer: Self-pay

## 2021-05-05 ENCOUNTER — Other Ambulatory Visit (HOSPITAL_COMMUNITY): Payer: Self-pay

## 2021-05-20 ENCOUNTER — Ambulatory Visit
Admission: RE | Admit: 2021-05-20 | Discharge: 2021-05-20 | Disposition: A | Discharge status: Home / Self Care | Payer: 59 | Source: Ambulatory Visit | Attending: Internal Medicine | Admitting: Internal Medicine

## 2021-05-20 ENCOUNTER — Other Ambulatory Visit: Payer: Self-pay

## 2021-05-20 DIAGNOSIS — Z1231 Encounter for screening mammogram for malignant neoplasm of breast: Secondary | ICD-10-CM

## 2021-05-28 ENCOUNTER — Other Ambulatory Visit (HOSPITAL_COMMUNITY): Payer: Self-pay

## 2021-05-31 DIAGNOSIS — H5213 Myopia, bilateral: Secondary | ICD-10-CM | POA: Diagnosis not present

## 2021-06-09 ENCOUNTER — Other Ambulatory Visit (HOSPITAL_COMMUNITY): Payer: Self-pay

## 2021-06-10 ENCOUNTER — Other Ambulatory Visit (HOSPITAL_COMMUNITY): Payer: Self-pay

## 2021-06-11 ENCOUNTER — Other Ambulatory Visit: Payer: Self-pay

## 2021-06-11 ENCOUNTER — Ambulatory Visit: Payer: 59 | Attending: Internal Medicine | Admitting: Pharmacist

## 2021-06-11 ENCOUNTER — Other Ambulatory Visit (HOSPITAL_COMMUNITY): Payer: Self-pay

## 2021-06-11 DIAGNOSIS — Z79899 Other long term (current) drug therapy: Secondary | ICD-10-CM

## 2021-06-11 NOTE — Progress Notes (Signed)
S: Patient presents today for review of their specialty medication.    Patient is currently taking Tecfidera (dimethyl fumarate) for MS. Patient is managed by Dr. Melrose Nakayama for this.    Adherence: confirms.    Efficacy: pt is happy with his results   Dosing: 240 mg BID   Renal adjustment: no adjustment necessary   Hepatic adjustment: no adjustment necessary   Dose adjustment for toxicity:  Flushing, GI intolerance, or intolerance to maintenance dose: Consider temporary dose reduction to 120 mg twice daily (resume recommended maintenance dose of 240 mg twice daily within 4 weeks). Consider discontinuation in patients who cannot tolerate return to the maintenance dose.   Hepatic injury (suspected drug-induced), clinically significant: Discontinue treatment.   Lymphocyte count <500/mm3 persisting for >6 months: Consider treatment interruption.   Serious infection: Consider withholding treatment until infection resolves.   Current adverse effects: Flushing, skin rash, or puritus: reports increase in flushing since changing to a different manufacturer. She notes that hydration and taking with larger meals helps. She continues to monitor at this time and is not requesting to stop medication. S/sx of infection: none  GI upset: none  S/sx of heptotoxicity: none  O:  CMP     Component Value Date/Time   NA 138 02/11/2020 0748   NA 138 07/19/2012 1435   K 4.1 02/11/2020 0748   K 4.1 07/19/2012 1435   CL 103 02/11/2020 0748   CL 104 07/19/2012 1435   CO2 26 02/11/2020 0748   CO2 28 07/19/2012 1435   GLUCOSE 98 02/11/2020 0748   GLUCOSE 82 07/19/2012 1435   BUN 19 02/11/2020 0748   BUN 11 07/19/2012 1435   CREATININE 0.68 02/11/2020 0748   CREATININE 0.76 07/19/2012 1435   CALCIUM 9.1 02/11/2020 0748   CALCIUM 8.8 07/19/2012 1435   PROT 7.5 02/11/2020 0748   ALBUMIN 4.4 02/11/2020 0748   AST 17 02/11/2020 0748   ALT 18 02/11/2020 0748   ALKPHOS 65 02/11/2020 0748   BILITOT  1.1 02/11/2020 0748   GFRNONAA >60 02/11/2020 0748   GFRNONAA >60 07/19/2012 1435   GFRAA >60 02/11/2020 0748   GFRAA >60 07/19/2012 1435   CBC    Component Value Date/Time   WBC 4.3 02/11/2020 0748   RBC 3.61 (L) 02/11/2020 0748   HGB 11.6 (L) 02/11/2020 0748   HGB 11.8 (L) 10/28/2013 1128   HCT 33.1 (L) 02/11/2020 0748   HCT 34.5 (L) 10/28/2013 1128   PLT 239 02/11/2020 0748   PLT 243 10/28/2013 1128   MCV 91.7 02/11/2020 0748   MCV 92 10/28/2013 1128   MCH 32.1 02/11/2020 0748   MCHC 35.0 02/11/2020 0748   RDW 11.9 02/11/2020 0748   RDW 12.2 10/28/2013 1128   LYMPHSABS 1.2 02/11/2020 0748   LYMPHSABS 2.0 07/19/2012 1435   MONOABS 0.5 02/11/2020 0748   MONOABS 0.6 07/19/2012 1435   EOSABS 0.1 02/11/2020 0748   EOSABS 0.1 07/19/2012 1435   BASOSABS 0.0 02/11/2020 0748   BASOSABS 0.0 07/19/2012 1435   A/P:  A/P: 1. Medication review: Patient is currently on Tecfidera for MS and is tolerating it well. Reviewed the medication with the patient, including the following: dimethyl fumarate activates the Nrf2 pathway, which is believed to result in anti-inflammatory and cytoprotective properties. The medication is oral and should be swallowed whole. Administering with a high-fat, high-protein meal may decrease flushing and GI side effects. Possible adverse effects include skin flushing, pruritus, GI upset, albuminura, infection, lymphocytopenia, and increased liver transaminases. Cases  of PML have been reported with severe, long-standing lymphopenia identified as the primary risk for PML. Dose adjustments for toxicities have been summarized above. Recommend to continue trying to take medication with meals and hydration. She will continue to monitor for changes in flushing.   Benard Halsted, PharmD, [facility], [place] [phone]

## 2021-06-15 ENCOUNTER — Other Ambulatory Visit (HOSPITAL_COMMUNITY)
Admission: RE | Admit: 2021-06-15 | Discharge: 2021-06-15 | Disposition: A | Discharge status: Home / Self Care | Payer: 59 | Source: Ambulatory Visit | Attending: Obstetrics and Gynecology | Admitting: Obstetrics and Gynecology

## 2021-06-15 ENCOUNTER — Other Ambulatory Visit: Payer: Self-pay

## 2021-06-15 ENCOUNTER — Encounter: Payer: Self-pay | Admitting: Obstetrics and Gynecology

## 2021-06-15 ENCOUNTER — Ambulatory Visit (INDEPENDENT_AMBULATORY_CARE_PROVIDER_SITE_OTHER): Payer: 59 | Admitting: Obstetrics and Gynecology

## 2021-06-15 VITALS — BP 120/70 | Ht 66.0 in | Wt 156.2 lb

## 2021-06-15 DIAGNOSIS — Z1211 Encounter for screening for malignant neoplasm of colon: Secondary | ICD-10-CM

## 2021-06-15 DIAGNOSIS — Z1231 Encounter for screening mammogram for malignant neoplasm of breast: Secondary | ICD-10-CM | POA: Diagnosis not present

## 2021-06-15 DIAGNOSIS — Z124 Encounter for screening for malignant neoplasm of cervix: Secondary | ICD-10-CM | POA: Diagnosis not present

## 2021-06-15 DIAGNOSIS — Z01419 Encounter for gynecological examination (general) (routine) without abnormal findings: Secondary | ICD-10-CM | POA: Diagnosis not present

## 2021-06-15 NOTE — Patient Instructions (Signed)
Institute of Medicine Recommended Dietary Allowances for Calcium and Vitamin D  Age (yr) Calcium Recommended Dietary Allowance (mg/day) Vitamin D Recommended Dietary Allowance (international units/day)  9-18 1,300 600  19-50 1,000 600  51-70 1,200 600  71 and older 1,200 800  Data from Institute of Medicine. Dietary reference intakes: calcium, vitamin D. [place], [place]: National Academies Press; 2011.   Exercising to Stay Healthy To become healthy and stay healthy, it is recommended that you do moderate-intensity and vigorous-intensity exercise. You can tell that you are exercising at a moderate intensity if your heart starts beating faster and you start breathing faster but can still hold a conversation. You can tell that you are exercising at a vigorous intensity if you are breathing much harder and faster and cannot hold a conversation while exercising. How can exercise benefit me? Exercising regularly is important. It has many health benefits, such as: Improving overall fitness, flexibility, and endurance. Increasing bone density. Helping with weight control. Decreasing body fat. Increasing muscle strength and endurance. Reducing stress and tension, anxiety, depression, or anger. Improving overall health. What guidelines should I follow while exercising? Before you start a new exercise program, talk with your health care provider. Do not exercise so much that you hurt yourself, feel dizzy, or get very short of breath. Wear comfortable clothes and wear shoes with good support. Drink plenty of water while you exercise to prevent dehydration or heat stroke. Work out until your breathing and your heartbeat get faster (moderate intensity). How often should I exercise? Choose an activity that you enjoy, and set realistic goals. Your health care provider can help you make an activity plan that is individually designed and works best for you. Exercise regularly as told by your health  care provider. This may include: Doing strength training two times a week, such as: Lifting weights. Using resistance bands. Push-ups. Sit-ups. Yoga. Doing a certain intensity of exercise for a given amount of time. Choose from these options: A total of 150 minutes of moderate-intensity exercise every week. A total of 75 minutes of vigorous-intensity exercise every week. A mix of moderate-intensity and vigorous-intensity exercise every week. Children, pregnant women, people who have not exercised regularly, people who are overweight, and older adults may need to talk with a health care provider about what activities are safe to perform. If you have a medical condition, be sure to talk with your health care provider before you start a new exercise program. What are some exercise ideas? Moderate-intensity exercise ideas include: Walking 1 mile (1.6 km) in about 15 minutes. Biking. Hiking. Golfing. Dancing. Water aerobics. Vigorous-intensity exercise ideas include: Walking 4.5 miles (7.2 km) or more in about 1 hour. Jogging or running 5 miles (8 km) in about 1 hour. Biking 10 miles (16.1 km) or more in about 1 hour. Lap swimming. Roller-skating or in-line skating. Cross-country skiing. Vigorous competitive sports, such as football, basketball, and soccer. Jumping rope. Aerobic dancing. What are some everyday activities that can help me get exercise? Yard work, such as: Pushing a lawn mower. Raking and bagging leaves. Washing your car. Pushing a stroller. Shoveling snow. Gardening. Washing windows or floors. How can I be more active in my day-to-day activities? Use stairs instead of an elevator. Take a walk during your lunch break. If you drive, park your car farther away from your work or school. If you take public transportation, get off one stop early and walk the rest of the way. Stand up or walk around during all of   your indoor phone calls. Get up, stretch, and walk  around every 30 minutes throughout the day. Enjoy exercise with a friend. Support to continue exercising will help you keep a regular routine of activity. Where to find more information You can find more information about exercising to stay healthy from: U.S. Department of Health and Human Services: www.hhs.gov Centers for Disease Control and Prevention (CDC): www.cdc.gov Summary Exercising regularly is important. It will improve your overall fitness, flexibility, and endurance. Regular exercise will also improve your overall health. It can help you control your weight, reduce stress, and improve your bone density. Do not exercise so much that you hurt yourself, feel dizzy, or get very short of breath. Before you start a new exercise program, talk with your health care provider. This information is not intended to replace advice given to you by your health care provider. Make sure you discuss any questions you have with your health care provider. Document Revised: 08/28/2020 Document Reviewed: 08/28/2020 Elsevier Patient Education  2022 Elsevier Inc. Budget-Friendly Healthy Eating There are many ways to save money at the grocery store and continue to eat healthy. You can be successful if you: Plan meals according to your budget. Make a grocery list and only purchase food according to your grocery list. Prepare food yourself at home. What are tips for following this plan? Reading food labels Compare food labels between brand name foods and the store brand. Often the nutritional value is the same, but the store brand is lower cost. Look for products that do not have added sugar, fat, or salt (sodium). These often cost the same but are healthier for you. Products may be labeled as: Sugar-free. Nonfat. Low-fat. Sodium-free. Low-sodium. Look for lean ground beef labeled as at least 92% lean and 8% fat. Shopping  Buy only the items on your grocery list and go only to the areas of the store  that have the items on your list. Use coupons only for foods and brands you normally buy. Avoid buying items you wouldn't normally buy simply because they are on sale. Check online and in newspapers for weekly deals. Buy healthy items from the bulk bins when available, such as herbs, spices, flour, pasta, nuts, and dried fruit. Buy fruits and vegetables that are in season. Prices are usually lower on in-season produce. Look at the unit price on the price tag. Use it to compare different brands and sizes to find out which item is the best deal. Choose healthy items that are often low-cost, such as carrots, potatoes, apples, bananas, and oranges. Dried or canned beans are a low-cost protein source. Buy in bulk and freeze extra food. Items you can buy in bulk include meats, fish, poultry, frozen fruits, and frozen vegetables. Avoid buying "ready-to-eat" foods, such as pre-cut fruits and vegetables and pre-made salads. If possible, shop around to discover where you can find the best prices. Consider other retailers such as dollar stores, larger wholesale stores, local fruit and vegetable stands, and farmers markets. Do not shop when you are hungry. If you shop while hungry, it may be hard to stick to your list and budget. Resist impulse buying. Use your grocery list as your official plan for the week. Buy a variety of vegetables and fruits by purchasing fresh, frozen, and canned items. Look at the top and bottom shelves for deals. Foods at eye level (eye level of an adult or child) are usually more expensive. Be efficient with your time when shopping. The more time you   spend at the store, the more money you are likely to spend. To save money when choosing more expensive foods like meats and dairy: Choose cheaper cuts of meat, such as bone-in chicken thighs and drumsticks instead of skinless and boneless chicken. When you are ready to prepare the chicken, you can remove the skin yourself to make it  healthier. Choose lean meats like chicken or turkey instead of beef. Choose canned seafood, such as tuna, salmon, or sardines. Buy eggs as a low-cost source of protein. Buy dried beans and peas, such as lentils, split peas, or kidney beans instead of meats. Dried beans and peas are a good alternative source of protein. Buy the larger tubs of yogurt instead of individual-sized containers. Choose water instead of sodas and other sweetened beverages. Avoid buying chips, cookies, and other "junk food." These items are usually expensive and not healthy. Cooking Make extra food and freeze the extras in meal-sized containers or in individual portions for fast meals and snacks. Pre-cook on days when you have extra time to prepare meals in advance. You can keep these meals in the fridge or freezer and reheat for a quick meal. When you come home from the grocery store, wash, peel, and cut fruits and vegetables so they are ready to use and eat. This will help reduce food waste. Meal planning Do not eat out or get fast food. Prepare food at home. Make a grocery list and make sure to bring it with you to the store. If you have a smart phone, you could use your phone to create your shopping list. Plan meals and snacks according to a grocery list and budget you create. Use leftovers in your meal plan for the week. Look for recipes where you can cook once and make enough food for two meals. Prepare budget-friendly types of meals like stews, casseroles, and stir-fry dishes. Try some meatless meals or try "no cook" meals like salads. Make sure that half your plate is filled with fruits or vegetables. Choose from fresh, frozen, or canned fruits and vegetables. If eating canned, remember to rinse them before eating. This will remove any excess salt added for packaging. Summary Eating healthy on a budget is possible if you plan your meals according to your budget, purchase according to your budget and grocery list,  and prepare food yourself. Tips for buying more food on a limited budget include buying generic brands, using coupons only for foods you normally buy, and buying healthy items from the bulk bins when available. Tips for buying cheaper food to replace expensive food include choosing cheaper, lean cuts of meat, and buying dried beans and peas. This information is not intended to replace advice given to you by your health care provider. Make sure you discuss any questions you have with your health care provider. Document Revised: 02/13/2020 Document Reviewed: 02/13/2020 Elsevier Patient Education  2022 Elsevier Inc. Bone Health Bones protect organs, store calcium, anchor muscles, and support the whole body. Keeping your bones strong is important, especially as you get older. You can take actions to help keep your bones strong and healthy. Why is keeping my bones healthy important? Keeping your bones healthy is important because your body constantly replaces bone cells. Cells get old, and new cells take their place. As we age, we lose bone cells because the body may not be able to make enough new cells to replace the old cells. The amount of bone cells and bone tissue you have is referred to as   bone mass. The higher your bone mass, the stronger your bones. The aging process leads to an overall loss of bone mass in the body, which can increase the likelihood of: Broken bones. A condition in which the bones become weak and brittle (osteoporosis). A large decline in bone mass occurs in older adults. In women, it occurs about the time of menopause. What actions can I take to keep my bones healthy? Good health habits are important for maintaining healthy bones. This includes eating nutritious foods and exercising regularly. To have healthy bones, you need to get enough of the right minerals and vitamins. Most nutrition experts recommend getting these nutrients from the foods that you eat. In some cases,  taking supplements may also be recommended. Doing certain types of exercise is also important for bone health. What are the nutritional recommendations for healthy bones? Eating a well-balanced diet with plenty of calcium and vitamin D will help to protect your bones. Nutritional recommendations vary from person to person. Ask your health care provider what is healthy for you. Here are some general guidelines. Get enough calcium Calcium is the most important (essential) mineral for bone health. Most people can get enough calcium from their diet, but supplements may be recommended for people who are at risk for osteoporosis. Good sources of calcium include: Dairy products, such as low-fat or nonfat milk, cheese, and yogurt. Dark green leafy vegetables, such as bok choy and broccoli. Foods that have calcium added to them (are fortified). Foods that may be fortified with calcium include orange juice, cereal, bread, soy beverages, and tofu products. Nuts, such as almonds. Follow these recommended amounts for daily calcium intake: Infants, 0-6 months: 200 mg. Infants, 6-12 months: 260 mg. Children, age 1-3: 700 mg. Children, age 4-8: 1,000 mg. Children, age 9-13: 1,300 mg. Teens, age 14-18: 1,300 mg. Adults, age 19-50: 1,000 mg. Adults, age 51-70: Men: 1,000 mg. Women: 1,200 mg. Adults, age 71 or older: 1,200 mg. Pregnant and breastfeeding females: Teens: 1,300 mg. Adults: 1,000 mg. Get enough vitamin D Vitamin D is the most essential vitamin for bone health. It helps the body absorb calcium. Sunlight stimulates the skin to make vitamin D, so be sure to get enough sunlight. If you live in a cold climate or you do not get outside often, your health care provider may recommend that you take vitamin D supplements. Good sources of vitamin D in your diet include: Egg yolks. Saltwater fish. Milk and cereal fortified with vitamin D. Follow these recommended amounts for daily vitamin D  intake: Infants, 0-12 months: 400 international units (IU). Children and teens, age 1-18: 600 international units. Adults, age 59 or younger: 600 international units. Adults, age 60 or older: 600-1,000 international units. Get other important nutrients Other nutrients that are important for bone health include: Phosphorus. This mineral is found in meat, poultry, dairy foods, nuts, and legumes. The recommended daily intake for adult men and adult women is 700 mg. Magnesium. This mineral is found in seeds, nuts, dark green vegetables, and legumes. The recommended daily intake for adult men is 400-420 mg. For adult women, it is 310-320 mg. Vitamin K. This vitamin is found in green leafy vegetables. The recommended daily intake is 120 mcg for adult men and 90 mcg for adult women. What type of physical activity is best for building and maintaining healthy bones? Weight-bearing and strength-building activities are important for building and maintaining healthy bones. Weight-bearing activities cause muscles and bones to work against gravity. Strength-building activities   increase the strength of the muscles that support bones. Weight-bearing and muscle-building activities include: Walking and hiking. Jogging and running. Dancing. Gym exercises. Lifting weights. Tennis and racquetball. Climbing stairs. Aerobics. Adults should get at least 30 minutes of moderate physical activity on most days. Children should get at least 60 minutes of moderate physical activity on most days. Ask your health care provider what type of exercise is best for you. How can I find out if my bone mass is low? Bone mass can be measured with an X-ray test called a bone mineral density (BMD) test. This test is recommended for all women who are age 65 or older. It may also be recommended for: Men who are age 70 or older. People who are at risk for osteoporosis because of: Having a long-term disease that weakens bones, such as  kidney disease or rheumatoid arthritis. Having menopause earlier than normal. Taking medicine that weakens bones, such as steroids, thyroid hormones, or hormone treatment for breast cancer or prostate cancer. Smoking. Drinking three or more alcoholic drinks a day. Being underweight. Sedentary lifestyle. If you find that you have a low bone mass, you may be able to prevent osteoporosis or further bone loss by changing your diet and lifestyle. Where can I find more information? Bone Health & Osteoporosis Foundation: www.nof.org/patients National Institutes of Health: www.bones.nih.gov International Osteoporosis Foundation: www.iofbonehealth.org Summary The aging process leads to an overall loss of bone mass in the body, which can increase the likelihood of broken bones and osteoporosis. Eating a well-balanced diet with plenty of calcium and vitamin D will help to protect your bones. Weight-bearing and strength-building activities are also important for building and maintaining strong bones. Bone mass can be measured with an X-ray test called a bone mineral density (BMD) test. This information is not intended to replace advice given to you by your health care provider. Make sure you discuss any questions you have with your health care provider. Document Revised: 10/14/2020 Document Reviewed: 10/14/2020 Elsevier Patient Education  2022 Elsevier Inc.  

## 2021-06-15 NOTE — Progress Notes (Signed)
Gynecology Annual Exam  PCP: McLean-Scocuzza, Nino Glow, MD  Chief Complaint:  Chief Complaint  Patient presents with   Gynecologic Exam    Annual exam    History of Present Illness: Patient is a [name] presents for annual exam. The patient has no complaints today.   LMP: Patient's last menstrual period was 01/26/2018. She denies postmenopausal bleeding or spotting  The patient is not currently sexually active. She denies dyspareunia.  Postcoital Bleeding: not applicable   The patient does perform self breast exams.  There is notable family history of breast or ovarian cancer in her family.  The patient has regular exercise: walking 2 days a week  The patient denies current symptoms of depression.   PHQ-9: 4 GAD-7: 1   Review of Systems: Review of Systems  Constitutional:  Negative for chills, fever, malaise/fatigue and weight loss.  HENT:  Negative for congestion, hearing loss and sinus pain.   Eyes:  Negative for blurred vision and double vision.  Respiratory:  Negative for cough, sputum production, shortness of breath and wheezing.   Cardiovascular:  Negative for chest pain, palpitations, orthopnea and leg swelling.  Gastrointestinal:  Negative for abdominal pain, constipation, diarrhea, nausea and vomiting.  Genitourinary:  Negative for dysuria, flank pain, frequency, hematuria and urgency.  Musculoskeletal:  Negative for back pain, falls and joint pain.  Skin:  Negative for itching and rash.  Neurological:  Negative for dizziness and headaches.  Psychiatric/Behavioral:  Negative for depression, substance abuse and suicidal ideas. The patient is not nervous/anxious.    Past Medical History:  Past Medical History:  Diagnosis Date   Depression    1997   Endometrial polyp    Family history of breast cancer 03/2018   cancer genetic testing letter sent   Family history of ovarian cancer    Multiple sclerosis ([place])    Shingles    right upper arm ? year  2015 or prior    Urine incontinence 2015   2 Episodes     Past Surgical History:  Past Surgical History:  Procedure Laterality Date   BREAST CYST ASPIRATION     neg   CESAREAN SECTION     COLONOSCOPY WITH PROPOFOL N/A 11/18/2016   Procedure: COLONOSCOPY WITH PROPOFOL;  Surgeon: Jonathon Bellows, MD;  Location: [facility] ENDOSCOPY;  Service: Endoscopy;  Laterality: N/A;   INTRAUTERINE DEVICE (IUD) INSERTION  01/02/2014   Mirena   OVARY SURGERY  6/252015   Right ovary removed.  Benign cyst.   POLYPECTOMY  2008,11/07/13   Removal of polyps from uterine. Vernie Ammons)   SALPINGECTOMY  11/07/2013   Right.Marland KitchenMarland KitchenHemorrhagic cyst   Sonohysterogram  05/07/2007   [facility]    Gynecologic History:  Patient's last menstrual period was 01/26/2018. Last Pap: Results were: 2019   Last mammogram: 2023 Results were: BI-RAD I  Obstetric History: G2P2002  Family History:  Family History  Problem Relation Age of Onset   Hyperlipidemia Mother    Hypertension Mother    Diabetes Mother    Osteoarthritis Mother    Hypothyroidism Mother    Alcohol abuse Father    Anuerysm Paternal Grandmother    Breast cancer Maternal Aunt 81   Breast cancer Cousin 73   Ovarian cancer Other 31    Social History:  Social History   Socioeconomic History   Marital status: Married    Spouse name: Not on file   Number of children: Not on file   Years of education: Not on file  Highest education level: Not on file  Occupational History   Not on file  Tobacco Use   Smoking status: Never   Smokeless tobacco: Never  Vaping Use   Vaping Use: Never used  Substance and Sexual Activity   Alcohol use: Not Currently    Alcohol/week: 1.0 standard drink    Types: 1 Glasses of wine per week    Comment: Once a month    Drug use: No   Sexual activity: Yes    Birth control/protection: None  Other Topics Concern   Not on file  Social History Narrative   Lives in [place] with Husband   2 children 43 and 17 both boys   1  Cat and 1 Rabbit   [employer] Tech   Enjoys attending church, being with her boys       Social Determinants of Health   Financial Resource Strain: Not on file  Food Insecurity: Not on file  Transportation Needs: Not on file  Physical Activity: Not on file  Stress: Not on file  Social Connections: Not on file  Intimate Partner Violence: Not on file    Allergies:  Allergies  Allergen Reactions   Ciprofloxacin Swelling    Cipro   Septra [Sulfamethoxazole-Trimethoprim] Swelling    Medications: Prior to Admission medications   Medication Sig Start Date End Date Taking? Authorizing Provider  Cholecalciferol (VITAMIN D) 2000 units CAPS Take 2,000 Units by mouth daily.    Yes [provider]  Dimethyl Fumarate 240 MG CPDR TAKE 1 CAPSULE (240 MG TOTAL) BY MOUTH 2 (TWO) TIMES DAILY. 02/01/21  Yes Tresa Garter, MD  FeFum-FePoly-FA-B Cmp-C-Biot (INTEGRA PLUS) CAPS Take 1 capsule by mouth daily. 11/18/20  Yes McLean-Scocuzza, Nino Glow, MD  metoprolol tartrate (LOPRESSOR) 100 MG tablet TAKE 1 TABLET BY MOUTH ONCE FOR 1 DOSE. TAKE 2 HOURS PRIOR TO CT. 03/09/20 03/09/21  Kate Sable, MD    Physical Exam Vitals: Blood pressure 120/70, height 5\' 6"  (1.676 m), weight 156 lb 3.2 oz (70.9 kg), last menstrual period 01/26/2018.  Physical Exam Constitutional:      Appearance: She is well-developed.  Genitourinary:     Genitourinary Comments: External: Normal appearing vulva. No lesions noted.  Speculum examination: Normal appearing cervix. No blood in the vaginal vault. No discharge.  Bimanual examination: Uterus midline, non-tender, 9 cm size, shape and contour.  No CMT. No adnexal masses. No adnexal tenderness. Pelvis not fixed.  Breast Exam: exam not performed- patient had recent breast exam with PCP   HENT:     Head: Normocephalic and atraumatic.  Neck:     Thyroid: No thyromegaly.  Cardiovascular:     Rate and Rhythm: Normal rate and regular rhythm.      Heart sounds: Normal heart sounds.  Pulmonary:     Effort: Pulmonary effort is normal.     Breath sounds: Normal breath sounds.  Abdominal:     General: Bowel sounds are normal. There is no distension.     Palpations: Abdomen is soft. There is no mass.  Musculoskeletal:     Cervical back: Neck supple.  Neurological:     Mental Status: She is alert and oriented to person, place, and time.  Skin:    General: Skin is warm and dry.  Psychiatric:        Behavior: Behavior normal.        Thought Content: Thought content normal.        Judgment: Judgment normal.  Vitals reviewed.  Female chaperone present for pelvic and breast  portions of the physical exam  Assessment: 56 y.o. G2P2002 routine annual exam  Plan: Problem List Items Addressed This Visit   None Visit Diagnoses     Encounter for annual routine gynecological examination    -  Primary   Cervical cancer screening       Relevant Orders   Cytology - PAP   Breast cancer screening by mammogram       Colon cancer screening           1) Mammogram - recommend yearly screening mammogram.  Mammogram Is up to date  2) STI screening was offered and declined  3) Pap smear today  4) Colonoscopy -- up to date, repeat in 2028  5) Routine healthcare maintenance including cholesterol, diabetes screening discussed managed by PCP  6) Osteoporosis screening - no increased risk factors  Adrian Prows MD, [place], [facility] 06/15/2021 8:53 AM

## 2021-06-17 LAB — CYTOLOGY - PAP
Comment: NEGATIVE
Diagnosis: NEGATIVE
High risk HPV: NEGATIVE

## 2021-07-05 ENCOUNTER — Other Ambulatory Visit (HOSPITAL_COMMUNITY): Payer: Self-pay

## 2021-07-07 ENCOUNTER — Other Ambulatory Visit (HOSPITAL_COMMUNITY): Payer: Self-pay

## 2021-07-09 ENCOUNTER — Other Ambulatory Visit (HOSPITAL_COMMUNITY): Payer: Self-pay

## 2021-07-12 ENCOUNTER — Other Ambulatory Visit (HOSPITAL_COMMUNITY): Payer: Self-pay

## 2021-07-28 ENCOUNTER — Encounter: Payer: Self-pay | Admitting: Obstetrics and Gynecology

## 2021-08-02 ENCOUNTER — Other Ambulatory Visit: Payer: Self-pay

## 2021-08-02 DIAGNOSIS — G35 Multiple sclerosis: Secondary | ICD-10-CM | POA: Diagnosis not present

## 2021-08-02 DIAGNOSIS — E538 Deficiency of other specified B group vitamins: Secondary | ICD-10-CM | POA: Diagnosis not present

## 2021-08-02 DIAGNOSIS — M79671 Pain in right foot: Secondary | ICD-10-CM | POA: Diagnosis not present

## 2021-08-02 DIAGNOSIS — R200 Anesthesia of skin: Secondary | ICD-10-CM | POA: Diagnosis not present

## 2021-08-02 MED ORDER — DIMETHYL FUMARATE 240 MG PO CPDR
DELAYED_RELEASE_CAPSULE | ORAL | 1 refills | Status: DC
  Filled 2021-08-02: qty 180, 30d supply, fill #0

## 2021-08-03 ENCOUNTER — Other Ambulatory Visit: Payer: Self-pay

## 2021-08-03 ENCOUNTER — Other Ambulatory Visit (HOSPITAL_COMMUNITY): Payer: Self-pay

## 2021-08-17 ENCOUNTER — Other Ambulatory Visit (HOSPITAL_COMMUNITY): Payer: Self-pay

## 2021-08-18 ENCOUNTER — Other Ambulatory Visit (HOSPITAL_COMMUNITY): Payer: Self-pay

## 2021-09-07 ENCOUNTER — Other Ambulatory Visit: Payer: Self-pay

## 2021-09-07 ENCOUNTER — Other Ambulatory Visit (HOSPITAL_COMMUNITY): Payer: Self-pay

## 2021-09-07 ENCOUNTER — Other Ambulatory Visit: Payer: Self-pay | Admitting: Pharmacist

## 2021-09-07 MED ORDER — GABAPENTIN 100 MG PO CAPS
ORAL_CAPSULE | ORAL | 3 refills | Status: DC
  Filled 2021-09-07: qty 60, 30d supply, fill #0

## 2021-09-07 MED ORDER — DIMETHYL FUMARATE 240 MG PO CPDR
DELAYED_RELEASE_CAPSULE | ORAL | 1 refills | Status: DC
  Filled 2021-09-07 – 2021-09-17 (×2): qty 60, 30d supply, fill #0
  Filled 2021-10-12: qty 60, 30d supply, fill #1
  Filled 2021-11-18: qty 60, 30d supply, fill #2
  Filled 2021-12-14 – 2021-12-21 (×2): qty 60, 30d supply, fill #3
  Filled 2022-01-12: qty 60, 30d supply, fill #4
  Filled 2022-02-16: qty 60, 30d supply, fill #5

## 2021-09-16 ENCOUNTER — Other Ambulatory Visit (HOSPITAL_COMMUNITY): Payer: Self-pay

## 2021-09-17 ENCOUNTER — Other Ambulatory Visit (HOSPITAL_COMMUNITY): Payer: Self-pay

## 2021-09-20 ENCOUNTER — Other Ambulatory Visit (HOSPITAL_COMMUNITY): Payer: Self-pay

## 2021-09-28 ENCOUNTER — Other Ambulatory Visit (HOSPITAL_COMMUNITY): Payer: Self-pay

## 2021-10-05 ENCOUNTER — Other Ambulatory Visit (HOSPITAL_COMMUNITY): Payer: Self-pay

## 2021-10-07 ENCOUNTER — Other Ambulatory Visit (HOSPITAL_COMMUNITY): Payer: Self-pay

## 2021-10-12 ENCOUNTER — Other Ambulatory Visit (HOSPITAL_COMMUNITY): Payer: Self-pay

## 2021-10-19 ENCOUNTER — Other Ambulatory Visit (HOSPITAL_COMMUNITY): Payer: Self-pay

## 2021-10-29 ENCOUNTER — Other Ambulatory Visit (HOSPITAL_COMMUNITY): Payer: Self-pay

## 2021-11-05 ENCOUNTER — Other Ambulatory Visit (HOSPITAL_COMMUNITY): Payer: Self-pay

## 2021-11-08 ENCOUNTER — Other Ambulatory Visit (HOSPITAL_COMMUNITY): Payer: Self-pay

## 2021-11-11 ENCOUNTER — Other Ambulatory Visit (HOSPITAL_COMMUNITY): Payer: Self-pay

## 2021-11-18 ENCOUNTER — Other Ambulatory Visit (HOSPITAL_COMMUNITY): Payer: Self-pay

## 2021-11-23 ENCOUNTER — Other Ambulatory Visit (HOSPITAL_COMMUNITY): Payer: Self-pay

## 2021-12-14 ENCOUNTER — Other Ambulatory Visit (HOSPITAL_COMMUNITY): Payer: Self-pay

## 2021-12-21 ENCOUNTER — Other Ambulatory Visit (HOSPITAL_COMMUNITY): Payer: Self-pay

## 2021-12-22 ENCOUNTER — Other Ambulatory Visit (HOSPITAL_COMMUNITY): Payer: Self-pay

## 2022-01-12 ENCOUNTER — Other Ambulatory Visit (HOSPITAL_COMMUNITY): Payer: Self-pay

## 2022-01-18 ENCOUNTER — Other Ambulatory Visit (HOSPITAL_COMMUNITY): Payer: Self-pay

## 2022-02-14 ENCOUNTER — Other Ambulatory Visit (HOSPITAL_COMMUNITY): Payer: Self-pay

## 2022-02-15 ENCOUNTER — Encounter: Payer: 59 | Admitting: Family Medicine

## 2022-02-16 ENCOUNTER — Other Ambulatory Visit (HOSPITAL_COMMUNITY): Payer: Self-pay

## 2022-02-17 ENCOUNTER — Other Ambulatory Visit (HOSPITAL_COMMUNITY): Payer: Self-pay

## 2022-02-18 ENCOUNTER — Other Ambulatory Visit (HOSPITAL_COMMUNITY): Payer: Self-pay

## 2022-02-21 ENCOUNTER — Other Ambulatory Visit (HOSPITAL_COMMUNITY): Payer: Self-pay

## 2022-02-22 ENCOUNTER — Other Ambulatory Visit (HOSPITAL_COMMUNITY): Payer: Self-pay

## 2022-02-24 ENCOUNTER — Other Ambulatory Visit (HOSPITAL_COMMUNITY): Payer: Self-pay

## 2022-03-14 ENCOUNTER — Other Ambulatory Visit: Payer: Self-pay

## 2022-03-14 ENCOUNTER — Other Ambulatory Visit (HOSPITAL_COMMUNITY): Payer: Self-pay

## 2022-03-14 DIAGNOSIS — E559 Vitamin D deficiency, unspecified: Secondary | ICD-10-CM | POA: Diagnosis not present

## 2022-03-14 DIAGNOSIS — G35 Multiple sclerosis: Secondary | ICD-10-CM | POA: Diagnosis not present

## 2022-03-14 DIAGNOSIS — M79671 Pain in right foot: Secondary | ICD-10-CM | POA: Diagnosis not present

## 2022-03-14 DIAGNOSIS — R200 Anesthesia of skin: Secondary | ICD-10-CM | POA: Diagnosis not present

## 2022-03-14 DIAGNOSIS — E538 Deficiency of other specified B group vitamins: Secondary | ICD-10-CM | POA: Diagnosis not present

## 2022-03-14 DIAGNOSIS — E038 Other specified hypothyroidism: Secondary | ICD-10-CM | POA: Diagnosis not present

## 2022-03-14 DIAGNOSIS — Z79899 Other long term (current) drug therapy: Secondary | ICD-10-CM | POA: Diagnosis not present

## 2022-03-14 DIAGNOSIS — R202 Paresthesia of skin: Secondary | ICD-10-CM | POA: Diagnosis not present

## 2022-03-14 MED ORDER — DIMETHYL FUMARATE 240 MG PO CPDR
DELAYED_RELEASE_CAPSULE | ORAL | 1 refills | Status: DC
  Filled 2022-03-14 (×2): qty 180, 90d supply, fill #0

## 2022-03-15 ENCOUNTER — Encounter: Payer: Self-pay | Admitting: Family Medicine

## 2022-03-15 ENCOUNTER — Ambulatory Visit: Payer: 59 | Admitting: Family Medicine

## 2022-03-15 VITALS — BP 124/80 | HR 81 | Temp 97.9°F | Ht 66.0 in | Wt 159.8 lb

## 2022-03-15 DIAGNOSIS — R7303 Prediabetes: Secondary | ICD-10-CM

## 2022-03-15 DIAGNOSIS — D649 Anemia, unspecified: Secondary | ICD-10-CM

## 2022-03-15 DIAGNOSIS — Z13220 Encounter for screening for lipoid disorders: Secondary | ICD-10-CM | POA: Diagnosis not present

## 2022-03-15 DIAGNOSIS — Z114 Encounter for screening for human immunodeficiency virus [HIV]: Secondary | ICD-10-CM

## 2022-03-15 DIAGNOSIS — R7309 Other abnormal glucose: Secondary | ICD-10-CM | POA: Diagnosis not present

## 2022-03-15 DIAGNOSIS — R7989 Other specified abnormal findings of blood chemistry: Secondary | ICD-10-CM | POA: Diagnosis not present

## 2022-03-15 DIAGNOSIS — G35 Multiple sclerosis: Secondary | ICD-10-CM | POA: Diagnosis not present

## 2022-03-15 DIAGNOSIS — Z79899 Other long term (current) drug therapy: Secondary | ICD-10-CM

## 2022-03-15 DIAGNOSIS — Z1159 Encounter for screening for other viral diseases: Secondary | ICD-10-CM

## 2022-03-15 DIAGNOSIS — E538 Deficiency of other specified B group vitamins: Secondary | ICD-10-CM | POA: Diagnosis not present

## 2022-03-15 DIAGNOSIS — K219 Gastro-esophageal reflux disease without esophagitis: Secondary | ICD-10-CM

## 2022-03-15 NOTE — Patient Instructions (Signed)
It was a pleasure meeting you today. Thank you for allowing me to take part in your health care.  Our goals for today as we discussed include:  We will get some labs today.  If they are abnormal or we need to do something about them, I will call you.  If they are normal, I will send you a message on MyChart (if it is active) or a letter in the mail.  If you don't hear from [place] in 2 weeks, please call the office at the number below.   Please schedule a lab appointment 1 week prior to your next visit.  You will need to fast for 12 hours prior to having blood work.  Recommend Flu vaccine  Recommend Shingles vaccine.  This is a 2 dose series and can be given at your local pharmacy.  Please talk to your pharmacist about this.   Follow up with Neurology as scheduled  Please follow-up with PCP as scheduled in November  If you have any questions or concerns, please do not hesitate to call the office at [phone].  I look forward to our next visit and until then take care and stay safe.  Regards,   Carollee Leitz, MD   [facility]

## 2022-03-15 NOTE — Assessment & Plan Note (Addendum)
Chronic Has had recent flare up.  Started Omeprazole -Continue Omeprazole 20 mg daily

## 2022-03-15 NOTE — Assessment & Plan Note (Addendum)
Chronic. Doing well.  Has not had flare since 06/2020.  Sees Dr Melrose Nakayama at [facility] -Continue Gabapentin 100 mg 1-2 times a day as needed for flare -Follow up with Neurology as scheduled.

## 2022-03-15 NOTE — Assessment & Plan Note (Addendum)
Chronic Reviewed recent A1c 5.8 (2020) Check A1c today

## 2022-03-15 NOTE — Progress Notes (Signed)
    SUBJECTIVE:   CHIEF COMPLAINT / HPI: transfer of care  Patient presents to clinic to transfer care  No acute concerns today  Multiple Sclerosis Doing well.  Has not had flare up since 06/2020.  Follows with Dr Melrose Nakayama at [facility].  Was seen yesterday.  Likely will transition to yearly follow ups.  Recent labs reviewed.  GERD Usually well controlled. Has had a recent flare and started Omeprazole.  Plans to completed 2 weeks course.   Vitamin B 12 Takes supplements. Tolerating well.   Polypharmacy Discussed with patient that may be overmedicating with vitamins.  Taking DiMethyl Fumerate, Integra Plus and One a day multivitamins.   PERTINENT  PMH / PSH:  Multiple Sclerosis HTN Peripheral Neuropathy   OBJECTIVE:   BP 124/80 (BP Location: Left Arm, Patient Position: Sitting, Cuff Size: Normal)   Pulse 81   Temp 97.9 F (36.6 C) (Oral)   Ht '5\' 6"'$  (1.676 m)   Wt 159 lb 12.8 oz (72.5 kg)   LMP 01/26/2018   SpO2 99%   BMI 25.79 kg/m    General: Alert, no acute distress Cardio: Normal S1 and S2, RRR, no r/m/g Pulm: CTAB, normal work of breathing Abdomen: Bowel sounds normal. Abdomen soft and non-tender.  Extremities: No peripheral edema.  Neuro: Cranial nerves grossly intact   ASSESSMENT/PLAN:   Multiple sclerosis (HCC) Chronic. Doing well.  Has not had flare since 06/2020.  Sees Dr Melrose Nakayama at [place] -Continue Gabapentin 100 mg 1-2 times a day as needed for flare -Follow up with Neurology as scheduled.  Vitamin B12 deficiency Chronic Takes Multivitamins Check Vitamin B 12 level today   Prediabetes Chronic Reviewed recent A1c 5.8 (2020) Check A1c today  GERD (gastroesophageal reflux disease) Chronic Has had recent flare up.  Started Omeprazole -Continue Omeprazole 20 mg daily  Polypharmacy Taking multiple multivitamins.  Do not think this amount is indicated.  At risk for iron overload, Vit D toxicity. -Recommend to choose one of her  current multivitamins to take daily -CBC, Vit B 12, Vit D today -Follow up at next scheduled visit    HCM PAP recent, 05/2021, NILM, HPV neg Colonoscopy due 2028 Mammogram due 2024 Flu vaccine scheduled to have 11/01 Recommend Shingles vaccine, declined today  PDMP Reviewed  Carollee Leitz, MD

## 2022-03-15 NOTE — Assessment & Plan Note (Addendum)
Chronic Takes Multivitamins Check Vitamin B 12 level today

## 2022-03-18 ENCOUNTER — Other Ambulatory Visit: Payer: 59

## 2022-03-21 ENCOUNTER — Telehealth: Payer: Self-pay

## 2022-03-21 ENCOUNTER — Other Ambulatory Visit: Payer: 59

## 2022-03-21 ENCOUNTER — Other Ambulatory Visit
Admission: RE | Admit: 2022-03-21 | Discharge: 2022-03-21 | Disposition: A | Discharge status: Home / Self Care | Payer: 59 | Attending: Family Medicine | Admitting: Family Medicine

## 2022-03-21 ENCOUNTER — Other Ambulatory Visit (HOSPITAL_COMMUNITY): Payer: Self-pay

## 2022-03-21 DIAGNOSIS — Z13220 Encounter for screening for lipoid disorders: Secondary | ICD-10-CM | POA: Insufficient documentation

## 2022-03-21 DIAGNOSIS — Z114 Encounter for screening for human immunodeficiency virus [HIV]: Secondary | ICD-10-CM | POA: Diagnosis not present

## 2022-03-21 DIAGNOSIS — Z1159 Encounter for screening for other viral diseases: Secondary | ICD-10-CM | POA: Diagnosis not present

## 2022-03-21 DIAGNOSIS — E538 Deficiency of other specified B group vitamins: Secondary | ICD-10-CM | POA: Insufficient documentation

## 2022-03-21 DIAGNOSIS — R7309 Other abnormal glucose: Secondary | ICD-10-CM | POA: Insufficient documentation

## 2022-03-21 DIAGNOSIS — R7989 Other specified abnormal findings of blood chemistry: Secondary | ICD-10-CM | POA: Diagnosis not present

## 2022-03-21 LAB — VITAMIN B12: Vitamin B-12: 575 pg/mL (ref 180–914)

## 2022-03-21 LAB — LIPID PANEL
Cholesterol: 162 mg/dL (ref 0–200)
HDL: 82 mg/dL (ref >40)
LDL Cholesterol: 71 mg/dL (ref 0–99)
Total CHOL/HDL Ratio: 2 RATIO
Triglycerides: 47 mg/dL (ref <150)
VLDL: 9 mg/dL (ref 0–40)

## 2022-03-21 LAB — VITAMIN D 25 HYDROXY (VIT D DEFICIENCY, FRACTURES): Vit D, 25-Hydroxy: 58.35 ng/mL (ref 30–100)

## 2022-03-21 LAB — HIV ANTIBODY (ROUTINE TESTING W REFLEX): HIV Screen 4th Generation wRfx: NONREACTIVE

## 2022-03-21 LAB — HEPATITIS C ANTIBODY: HCV Ab: NONREACTIVE

## 2022-03-21 LAB — HEMOGLOBIN A1C
Hgb A1c MFr Bld: 5.3 % (ref 4.8–5.6)
Mean Plasma Glucose: 105.41 mg/dL

## 2022-03-21 NOTE — Addendum Note (Signed)
Addended by: Gracy Racer on: 03/21/2022 08:12 AM   Modules accepted: Orders

## 2022-03-21 NOTE — Addendum Note (Signed)
Addended by: Gracy Racer on: 03/21/2022 08:13 AM   Modules accepted: Orders

## 2022-03-21 NOTE — Telephone Encounter (Signed)
Lvm for pt to return call to R/S lab appt today due to no lab tech in ofc.

## 2022-03-22 ENCOUNTER — Other Ambulatory Visit (HOSPITAL_COMMUNITY): Payer: Self-pay

## 2022-03-22 ENCOUNTER — Other Ambulatory Visit: Payer: Self-pay | Admitting: Pharmacist

## 2022-03-22 MED ORDER — DIMETHYL FUMARATE 240 MG PO CPDR
DELAYED_RELEASE_CAPSULE | ORAL | 1 refills | Status: DC
  Filled 2022-03-22: qty 60, 30d supply, fill #0
  Filled 2022-04-21 – 2022-04-27 (×3): qty 60, 30d supply, fill #1
  Filled 2022-05-30: qty 60, 30d supply, fill #2
  Filled 2022-06-29: qty 60, 30d supply, fill #3
  Filled 2022-07-28: qty 60, 30d supply, fill #4
  Filled 2022-08-26: qty 60, 30d supply, fill #5

## 2022-03-25 ENCOUNTER — Ambulatory Visit: Payer: 59 | Admitting: Internal Medicine

## 2022-03-27 ENCOUNTER — Encounter: Payer: Self-pay | Admitting: Family Medicine

## 2022-03-27 DIAGNOSIS — Z79899 Other long term (current) drug therapy: Secondary | ICD-10-CM | POA: Insufficient documentation

## 2022-03-27 NOTE — Assessment & Plan Note (Signed)
Taking multiple multivitamins.  Do not think this amount is indicated.  At risk for iron overload, Vit D toxicity. -Recommend to choose one of her current multivitamins to take daily -CBC, Vit B 12, Vit D today -Follow up at next scheduled visit

## 2022-03-29 ENCOUNTER — Encounter: Payer: Self-pay | Admitting: Family Medicine

## 2022-03-29 ENCOUNTER — Ambulatory Visit (INDEPENDENT_AMBULATORY_CARE_PROVIDER_SITE_OTHER): Payer: 59 | Admitting: Family Medicine

## 2022-03-29 VITALS — BP 122/80 | HR 85 | Temp 97.6°F | Ht 66.0 in | Wt 157.4 lb

## 2022-03-29 DIAGNOSIS — Z0001 Encounter for general adult medical examination with abnormal findings: Secondary | ICD-10-CM | POA: Diagnosis not present

## 2022-03-29 DIAGNOSIS — E538 Deficiency of other specified B group vitamins: Secondary | ICD-10-CM

## 2022-03-29 DIAGNOSIS — Z1329 Encounter for screening for other suspected endocrine disorder: Secondary | ICD-10-CM

## 2022-03-29 DIAGNOSIS — Z0000 Encounter for general adult medical examination without abnormal findings: Secondary | ICD-10-CM

## 2022-03-29 DIAGNOSIS — D649 Anemia, unspecified: Secondary | ICD-10-CM

## 2022-03-29 LAB — COMPREHENSIVE METABOLIC PANEL
ALT: 19 U/L (ref 0–35)
AST: 18 U/L (ref 0–37)
Albumin: 4.9 g/dL (ref 3.5–5.2)
Alkaline Phosphatase: 86 U/L (ref 39–117)
BUN: 15 mg/dL (ref 6–23)
CO2: 30 mEq/L (ref 19–32)
Calcium: 9.8 mg/dL (ref 8.4–10.5)
Chloride: 101 mEq/L (ref 96–112)
Creatinine, Ser: 0.64 mg/dL (ref 0.40–1.20)
GFR: 99.34 mL/min (ref >60.00)
Glucose, Bld: 97 mg/dL (ref 70–99)
Potassium: 4.4 mEq/L (ref 3.5–5.1)
Sodium: 139 mEq/L (ref 135–145)
Total Bilirubin: 0.6 mg/dL (ref 0.2–1.2)
Total Protein: 7.8 g/dL (ref 6.0–8.3)

## 2022-03-29 LAB — URINALYSIS, ROUTINE W REFLEX MICROSCOPIC
Bilirubin Urine: NEGATIVE
Hgb urine dipstick: NEGATIVE
Ketones, ur: NEGATIVE
Leukocytes,Ua: NEGATIVE
Nitrite: NEGATIVE
Specific Gravity, Urine: 1.01 (ref 1.000–1.030)
Total Protein, Urine: NEGATIVE
Urine Glucose: NEGATIVE
Urobilinogen, UA: 0.2 (ref 0.0–1.0)
pH: 7 (ref 5.0–8.0)

## 2022-03-29 LAB — CBC WITH DIFFERENTIAL/PLATELET
Basophils Absolute: 0 10*3/uL (ref 0.0–0.1)
Basophils Relative: 0.3 % (ref 0.0–3.0)
Eosinophils Absolute: 0.1 10*3/uL (ref 0.0–0.7)
Eosinophils Relative: 2 % (ref 0.0–5.0)
HCT: 37.7 % (ref 36.0–46.0)
Hemoglobin: 12.9 g/dL (ref 12.0–15.0)
Lymphocytes Relative: 32.3 % (ref 12.0–46.0)
Lymphs Abs: 1.7 10*3/uL (ref 0.7–4.0)
MCHC: 34.1 g/dL (ref 30.0–36.0)
MCV: 93.1 fl (ref 78.0–100.0)
Monocytes Absolute: 0.5 10*3/uL (ref 0.1–1.0)
Monocytes Relative: 9.8 % (ref 3.0–12.0)
Neutro Abs: 3 10*3/uL (ref 1.4–7.7)
Neutrophils Relative %: 55.6 % (ref 43.0–77.0)
Platelets: 308 10*3/uL (ref 150.0–400.0)
RBC: 4.05 Mil/uL (ref 3.87–5.11)
RDW: 12.3 % (ref 11.5–15.5)
WBC: 5.4 10*3/uL (ref 4.0–10.5)

## 2022-03-29 LAB — IBC + FERRITIN
Ferritin: 124.1 ng/mL (ref 10.0–291.0)
Iron: 113 ug/dL (ref 42–145)
Saturation Ratios: 34.1 % (ref 20.0–50.0)
TIBC: 331.8 ug/dL (ref 250.0–450.0)
Transferrin: 237 mg/dL (ref 212.0–360.0)

## 2022-03-29 LAB — TSH: TSH: 2.82 u[IU]/mL (ref 0.35–5.50)

## 2022-03-29 NOTE — Progress Notes (Unsigned)
   SUBJECTIVE:   Chief Complaint  Patient presents with   Annual Exam    Physical   HPI  Patient presents to clinic for annual physical exam.  No acute concerns today  Reviewed recent labs with patient.  No significant findings.  Remains active.  HCM up-to-date Mammogram due 2025 Pap due 2026 Colonoscopy due 2028 Tdap due 2031  PERTINENT PMH / PSH: MS  OBJECTIVE:  BP 122/80 (BP Location: Left Arm, Patient Position: Sitting, Cuff Size: Normal)   Pulse 85   Temp 97.6 F (36.4 C) (Oral)   Ht '5\' 6"'$  (1.676 m)   Wt 157 lb 6.4 oz (71.4 kg)   LMP 01/26/2018   SpO2 98%   BMI 25.41 kg/m    Physical Exam Vitals reviewed.  Constitutional:      General: She is not in acute distress.    Appearance: She is not ill-appearing.  HENT:     Head: Normocephalic.     Right Ear: Tympanic membrane, ear canal and external ear normal.     Left Ear: Tympanic membrane, ear canal and external ear normal.     Nose: Nose normal.     Mouth/Throat:     Mouth: Mucous membranes are moist.  Eyes:     Extraocular Movements: Extraocular movements intact.     Conjunctiva/sclera: Conjunctivae normal.     Pupils: Pupils are equal, round, and reactive to light.  Neck:     Vascular: No carotid bruit.  Cardiovascular:     Rate and Rhythm: Normal rate and regular rhythm.     Pulses: Normal pulses.     Heart sounds: Normal heart sounds.  Pulmonary:     Effort: Pulmonary effort is normal.     Breath sounds: Normal breath sounds.  Abdominal:     General: Bowel sounds are normal. There is no distension.     Palpations: Abdomen is soft.     Tenderness: There is no abdominal tenderness. There is no right CVA tenderness, left CVA tenderness, guarding or rebound.  Musculoskeletal:        General: Normal range of motion.     Cervical back: Normal range of motion.     Right lower leg: No edema.     Left lower leg: No edema.  Lymphadenopathy:     Cervical: No cervical adenopathy.  Skin:     Capillary Refill: Capillary refill takes less than 2 seconds.  Neurological:     General: No focal deficit present.     Mental Status: She is alert and oriented to person, place, and time. Mental status is at baseline.     Motor: No weakness.  Psychiatric:        Mood and Affect: Mood normal.        Behavior: Behavior normal.        Thought Content: Thought content normal.        Judgment: Judgment normal.     ASSESSMENT/PLAN:  Annual physical exam -     CBC with Differential/Platelet -     TSH -     Comprehensive metabolic panel -     Urinalysis, Routine w reflex microscopic -     IBC + Ferritin  Flu vaccine [facility] Nov/23 at [facility]  PDMP reviewed  Return in about 1 year (around 03/30/2023).  Carollee Leitz, MD

## 2022-03-29 NOTE — Patient Instructions (Addendum)
It was a pleasure meeting you today. Thank you for allowing me to take part in your health care.  Our goals for today as we discussed include:  Your blood pressure is within normal range  Unfortunately, your labs did not be completed at your last clinic visit.  We will collect some of those labs on MyChart me with the results.  Your cholesterol screening was normal  Your diabetes screening was normal  Your mammogram is due 2025  Your Pap smear is due in 2026  Your colonoscopy is due in 2028  Your tetanus booster is due in 2031  Recommend Shingles vaccine.  This is a 2 dose series and can be given at your local pharmacy.  Please talk to your pharmacist about this.   Please follow-up with PCP in 1 year for your next annual physical or sooner if needed  If you have any questions or concerns, please do not hesitate to call the office at [phone].  I look forward to our next visit and until then take care and stay safe.  Regards,   Carollee Leitz, MD   [facility]

## 2022-03-30 ENCOUNTER — Other Ambulatory Visit (HOSPITAL_COMMUNITY): Payer: Self-pay

## 2022-03-31 ENCOUNTER — Other Ambulatory Visit (HOSPITAL_COMMUNITY): Payer: Self-pay

## 2022-04-11 ENCOUNTER — Encounter: Payer: Self-pay | Admitting: Family Medicine

## 2022-04-12 NOTE — Assessment & Plan Note (Signed)
HCM up-to-date Mammogram due 2025 Pap due 2026 Colonoscopy due 2028 Tdap due 2031 Flu vaccine 11/23 at [facility]

## 2022-04-19 ENCOUNTER — Other Ambulatory Visit (HOSPITAL_COMMUNITY): Payer: Self-pay

## 2022-04-20 ENCOUNTER — Encounter: Payer: 59 | Admitting: Family Medicine

## 2022-04-21 ENCOUNTER — Other Ambulatory Visit (HOSPITAL_COMMUNITY): Payer: Self-pay

## 2022-04-27 ENCOUNTER — Other Ambulatory Visit: Payer: Self-pay

## 2022-04-27 ENCOUNTER — Other Ambulatory Visit (HOSPITAL_COMMUNITY): Payer: Self-pay

## 2022-05-18 ENCOUNTER — Other Ambulatory Visit (HOSPITAL_COMMUNITY): Payer: Self-pay

## 2022-05-26 ENCOUNTER — Other Ambulatory Visit: Payer: Self-pay

## 2022-05-26 ENCOUNTER — Ambulatory Visit (INDEPENDENT_AMBULATORY_CARE_PROVIDER_SITE_OTHER): Payer: 59 | Admitting: Nurse Practitioner

## 2022-05-26 ENCOUNTER — Encounter: Payer: Self-pay | Admitting: Nurse Practitioner

## 2022-05-26 VITALS — BP 136/88 | HR 103 | Temp 99.0°F | Ht 66.0 in | Wt 159.2 lb

## 2022-05-26 DIAGNOSIS — J40 Bronchitis, not specified as acute or chronic: Secondary | ICD-10-CM

## 2022-05-26 MED ORDER — BENZONATATE 100 MG PO CAPS
100.0000 mg | ORAL_CAPSULE | Freq: Two times a day (BID) | ORAL | 0 refills | Status: DC | PRN
  Filled 2022-05-26: qty 30, 15d supply, fill #0

## 2022-05-26 MED ORDER — PREDNISONE 20 MG PO TABS
20.0000 mg | ORAL_TABLET | Freq: Every day | ORAL | 0 refills | Status: DC
  Filled 2022-05-26: qty 5, 5d supply, fill #0

## 2022-05-26 NOTE — Progress Notes (Signed)
Established Patient Office Visit  Subjective:  Patient ID: Pam Hamilton, female    DOB: Feb 01, 1966  Age: 57 y.o. MRN: [id number]  CC:  Chief Complaint  Patient presents with   Cough    Congestion and headache, teeth hurt     HPI  Pam Hamilton presents for   Cough The current episode started 1 to 4 weeks ago. The problem has been gradually worsening. The cough is Productive of sputum. Associated symptoms include ear congestion, headaches, postnasal drip, rhinorrhea, shortness of breath and wheezing. Pertinent negatives include no chest pain or fever. The symptoms are aggravated by lying down. She has tried OTC cough suppressant for the symptoms. The treatment provided mild relief.     Past Medical History:  Diagnosis Date   Abnormal bleeding in menstrual cycle 01/17/2017   Annual physical exam    Depression    1997   Encounter to establish care 06/23/2014   Endometrial polyp    Family history of breast cancer 03/2018   cancer genetic testing letter sent; neg testing per pt (no details/date in chart)   Family history of ovarian cancer    First degree hemorrhoids    Menopausal hot flushes 01/17/2017   Multiple sclerosis ([place])    Numbness and tingling of right arm 01/06/2014   Routine general medical examination at a health care facility 06/23/2014   Shingles    right upper arm ? year 2015 or prior    Urine incontinence 2015   2 Episodes     Past Surgical History:  Procedure Laterality Date   BREAST CYST ASPIRATION     neg   CESAREAN SECTION     COLONOSCOPY WITH PROPOFOL N/A 11/18/2016   Procedure: COLONOSCOPY WITH PROPOFOL;  Surgeon: Jonathon Bellows, MD;  Location[facility] ENDOSCOPY;  Service: Endoscopy;  Laterality: N/A;   INTRAUTERINE DEVICE (IUD) INSERTION  01/02/2014   Mirena   OVARY SURGERY  6/252015   Right ovary removed.  Benign cyst.   POLYPECTOMY  2008,11/07/13   Removal of polyps from uterine. Vernie Ammons)   SALPINGECTOMY  11/07/2013    Right.Marland KitchenMarland KitchenHemorrhagic cyst   Sonohysterogram  05/07/2007   [facility]    Family History  Problem Relation Age of Onset   Hyperlipidemia Mother    Hypertension Mother    Diabetes Mother    Osteoarthritis Mother    Hypothyroidism Mother    Alcohol abuse Father    Anuerysm Paternal Grandmother    Breast cancer Maternal Aunt 39   Breast cancer Cousin 57   Ovarian cancer Other 37    Social History   Socioeconomic History   Marital status: Married    Spouse name: Not on file   Number of children: Not on file   Years of education: Not on file   Highest education level: Not on file  Occupational History   Not on file  Tobacco Use   Smoking status: Never   Smokeless tobacco: Never  Vaping Use   Vaping Use: Never used  Substance and Sexual Activity   Alcohol use: Not Currently    Alcohol/week: 1.0 standard drink of alcohol    Types: 1 Glasses of wine per week    Comment: Once a month    Drug use: No   Sexual activity: Yes    Birth control/protection: None  Other Topics Concern   Not on file  Social History Narrative   Lives in [place] with Husband   2 children 15 and 66 both boys   1  Cat and 1 Rabbit   [employer] Tech   Enjoys attending church, being with her boys       Social Determinants of Health   Financial Resource Strain: Not on file  Food Insecurity: Not on file  Transportation Needs: Not on file  Physical Activity: Not on file  Stress: Not on file  Social Connections: Not on file  Intimate Partner Violence: Not on file     Outpatient Medications Prior to Visit  Medication Sig Dispense Refill   Cholecalciferol (VITAMIN D) 2000 units CAPS Take 1,000 Units by mouth daily.     Dimethyl Fumarate 240 MG CPDR TAKE 1 CAPSULE (240 MG TOTAL) BY MOUTH 2 (TWO) TIMES DAILY. 180 capsule 1   FeFum-FePoly-FA-B Cmp-C-Biot (INTEGRA PLUS) CAPS Take 1 capsule by mouth daily. 90 capsule 3   loratadine (CLARITIN) 10 MG tablet Take 10 mg by mouth daily.      Multiple Vitamin (MULTIVITAMIN) tablet Take 1 tablet by mouth daily.     No facility-administered medications prior to visit.    Allergies  Allergen Reactions   Ciprofloxacin Swelling    Cipro   Septra [Sulfamethoxazole-Trimethoprim] Swelling    ROS Review of Systems  Constitutional:  Negative for fever.  HENT:  Positive for postnasal drip and rhinorrhea.   Respiratory:  Positive for cough, shortness of breath and wheezing.   Cardiovascular:  Negative for chest pain.  Neurological:  Positive for headaches.      Objective:    Physical Exam Constitutional:      Appearance: Normal appearance. She is normal weight.  HENT:     Right Ear: Tympanic membrane normal.     Left Ear: Tympanic membrane normal.  Cardiovascular:     Rate and Rhythm: Normal rate and regular rhythm.     Pulses: Normal pulses.     Heart sounds: Normal heart sounds.  Pulmonary:     Breath sounds: Wheezing present.  Abdominal:     General: Bowel sounds are normal.     Palpations: Abdomen is soft.  Skin:    General: Skin is warm.  Neurological:     General: No focal deficit present.     Mental Status: She is oriented to person, place, and time. Mental status is at baseline.     BP 136/88   Pulse (!) 103   Temp 99 F (37.2 C) (Oral)   Ht '5\' 6"'$  (1.676 m)   Wt 159 lb 3.2 oz (72.2 kg)   LMP 01/26/2018   SpO2 97%   BMI 25.70 kg/m  Wt Readings from Last 3 Encounters:  06/01/22 159 lb (72.1 kg)  05/26/22 159 lb 3.2 oz (72.2 kg)  03/29/22 157 lb 6.4 oz (71.4 kg)     Health Maintenance  Topic Date Due   COVID-19 Vaccine (7 - 2023-24 season) 06/17/2022 (Originally 04/11/2022)   Zoster Vaccines- Shingrix (1 of 2) 06/29/2022 (Originally 08/11/1985)   INFLUENZA VACCINE  08/14/2022 (Originally 12/14/2021)   MAMMOGRAM  05/21/2023   PAP SMEAR-Modifier  06/15/2024   COLONOSCOPY (Pts 45-65yr Insurance coverage will need to be confirmed)  11/19/2026   DTaP/Tdap/Td (3 - Td or Tdap) 02/05/2030    Hepatitis C Screening  Completed   HIV Screening  Completed   HPV VACCINES  Aged Out    There are no preventive care reminders to display for this patient.  Lab Results  Component Value Date   TSH 2.82 03/29/2022   Lab Results  Component Value Date   WBC 5.4 03/29/2022  HGB 12.9 03/29/2022   HCT 37.7 03/29/2022   MCV 93.1 03/29/2022   PLT 308.0 03/29/2022   Lab Results  Component Value Date   NA 139 03/29/2022   K 4.4 03/29/2022   CO2 30 03/29/2022   GLUCOSE 97 03/29/2022   BUN 15 03/29/2022   CREATININE 0.64 03/29/2022   BILITOT 0.6 03/29/2022   ALKPHOS 86 03/29/2022   AST 18 03/29/2022   ALT 19 03/29/2022   PROT 7.8 03/29/2022   ALBUMIN 4.9 03/29/2022   CALCIUM 9.8 03/29/2022   ANIONGAP 9 02/11/2020   GFR 99.34 03/29/2022   Lab Results  Component Value Date   CHOL 162 03/21/2022   Lab Results  Component Value Date   HDL 82 03/21/2022   Lab Results  Component Value Date   LDLCALC 71 03/21/2022   Lab Results  Component Value Date   TRIG 47 03/21/2022   Lab Results  Component Value Date   CHOLHDL 2.0 03/21/2022   Lab Results  Component Value Date   HGBA1C 5.3 03/21/2022      Assessment & Plan:   Problem List Items Addressed This Visit       Respiratory   Bronchitis - Primary    Started her on Tessalon Perles 100 mg twice a day and Prednisone 20 mg for 5 days. Advised patient to increase hydration and use humidifier. Advised patient to call the office if symptoms does not improve.        Meds ordered this encounter  Medications   DISCONTD: predniSONE (DELTASONE) 20 MG tablet    Sig: Take 1 tablet (20 mg total) by mouth daily with breakfast.    Dispense:  5 tablet    Refill:  0   DISCONTD: benzonatate (TESSALON PERLES) 100 MG capsule    Sig: Take 1 capsule (100 mg total) by mouth 2 (two) times daily as needed for cough.    Dispense:  30 capsule    Refill:  0     Follow-up: No follow-ups on file.    Theresia Lo, NP

## 2022-05-30 ENCOUNTER — Other Ambulatory Visit (HOSPITAL_COMMUNITY): Payer: Self-pay

## 2022-05-31 ENCOUNTER — Encounter: Payer: Self-pay | Admitting: Family Medicine

## 2022-06-01 ENCOUNTER — Other Ambulatory Visit: Payer: Self-pay

## 2022-06-01 ENCOUNTER — Ambulatory Visit (INDEPENDENT_AMBULATORY_CARE_PROVIDER_SITE_OTHER): Payer: 59 | Admitting: Primary Care

## 2022-06-01 ENCOUNTER — Encounter: Payer: Self-pay | Admitting: Nurse Practitioner

## 2022-06-01 ENCOUNTER — Other Ambulatory Visit: Payer: Self-pay | Admitting: Family Medicine

## 2022-06-01 ENCOUNTER — Encounter: Payer: Self-pay | Admitting: Primary Care

## 2022-06-01 VITALS — BP 136/82 | HR 95 | Temp 100.0°F | Ht 66.0 in | Wt 159.0 lb

## 2022-06-01 DIAGNOSIS — J40 Bronchitis, not specified as acute or chronic: Secondary | ICD-10-CM | POA: Insufficient documentation

## 2022-06-01 DIAGNOSIS — R051 Acute cough: Secondary | ICD-10-CM | POA: Insufficient documentation

## 2022-06-01 MED ORDER — HYDROCOD POLI-CHLORPHE POLI ER 10-8 MG/5ML PO SUER
5.0000 mL | Freq: Every evening | ORAL | 0 refills | Status: DC | PRN
  Filled 2022-06-01: qty 50, 10d supply, fill #0

## 2022-06-01 MED ORDER — AMOXICILLIN-POT CLAVULANATE 875-125 MG PO TABS
1.0000 | ORAL_TABLET | Freq: Two times a day (BID) | ORAL | 0 refills | Status: DC
  Filled 2022-06-01: qty 14, 7d supply, fill #0

## 2022-06-01 NOTE — Assessment & Plan Note (Signed)
Started her on Tessalon Perles 100 mg twice a day and Prednisone 20 mg for 5 days. Advised patient to increase hydration and use humidifier. Advised patient to call the office if symptoms does not improve.

## 2022-06-01 NOTE — Patient Instructions (Addendum)
Augmentin has been sent. Take one pill by mouth twice daily for 7 days.   Cough medicine has been sent to the pharmacy. It can make you drowsy.   Recommend close follow up with PCP if your symptoms worsen or there is no improvement.  It was a pleasure to see you today!

## 2022-06-01 NOTE — Assessment & Plan Note (Addendum)
Differentials include sinusitis, community acquired pneumonia. Given presentation and physiological symptoms such as tachycardia and fever on exam, will go ahead and treat.  Augmentin 875-125 mg 1 tablet by mouth twice daily for seven days. Prescription sent.  No abnormal breath sounds on exam so will defer chest xray.  Tussinonex 5 ml at bedtime as needed for cough. Prescription sent.   Recommend close follow up with PCP.   I evaluated patient, was consulted regarding treatment, and agree with assessment and plan per Tinnie Gens, RN, DNP student.   Allie Bossier, NP-C

## 2022-06-01 NOTE — Telephone Encounter (Signed)
Pt was seen at [facility] office today.

## 2022-06-01 NOTE — Telephone Encounter (Signed)
Patient called about MyChart message.sent last night, 05/31/22. She is not feeling better.

## 2022-06-01 NOTE — Progress Notes (Signed)
Established Patient Office Visit  Subjective   Patient ID: Pam Hamilton, female    DOB: 06-06-1965  Age: 57 y.o. MRN: [id number]  Chief Complaint  Patient presents with   Nasal Congestion    Recently seen on 05/26/22 for Bronchitis  She is still experiencing Chest pain under breast when she coughs, headache and head pressure, fatigue. Cough has been present since 12/25 on and off. In the last 10 days her symptoms have progressively gotten worse.    HPI  Pam Hamilton is a 57 year old female, patient of Dr. Volanda Napoleon, with past medical history of GERD, anxiety, prediabetes presents today for an acute visit.   Her cough started on Christmas, which she had been recovering from another viral upper respiratory infection. She was evaluated on 05/26/22 for cough, postnasal drip, face pain, teeth pain and was treated with Prednisone 20 mg for five days and Benzonatate and she has had no relief.   She is still experiencing chest pain under her breast with coughing. She is still having sinus pain, pressure, bilateral ear pain, intermittent post nasal drip. Feels that her ears are full. No hearing loss.   She was tested for covid at home on 05/26/22 which was negative. She hasn't been around anyone sick. She felt feverish but didn't check. She has been taken sudafed, mucinex, nyquil with no relief. She has been sleeping sitting up. Her cough is triggered when she lays down.   Patient Active Problem List   Diagnosis Date Noted   Acute cough 06/01/2022   Prediabetes 01/16/2019   Anxiety 01/10/2019   Multiple sclerosis ([place]) 11/02/2017   Anemia 11/02/2017   Annual physical exam    Diverticulosis of large intestine without diverticulitis    GERD (gastroesophageal reflux disease) 09/26/2016   Vitamin B12 deficiency 08/17/2016   Past Medical History:  Diagnosis Date   Abnormal bleeding in menstrual cycle 01/17/2017   Annual physical exam    Depression    1997   Encounter to establish care  06/23/2014   Endometrial polyp    Family history of breast cancer 03/2018   cancer genetic testing letter sent; neg testing per pt (no details/date in chart)   Family history of ovarian cancer    First degree hemorrhoids    Menopausal hot flushes 01/17/2017   Multiple sclerosis ([place])    Numbness and tingling of right arm 01/06/2014   Routine general medical examination at a health care facility 06/23/2014   Shingles    right upper arm ? year 2015 or prior    Urine incontinence 2015   2 Episodes    Past Surgical History:  Procedure Laterality Date   BREAST CYST ASPIRATION     neg   CESAREAN SECTION     COLONOSCOPY WITH PROPOFOL N/A 11/18/2016   Procedure: COLONOSCOPY WITH PROPOFOL;  Surgeon: Jonathon Bellows, MD;  Location:  [facility] ENDOSCOPY;  Service: Endoscopy;  Laterality: N/A;   INTRAUTERINE DEVICE (IUD) INSERTION  01/02/2014   Mirena   OVARY SURGERY  6/252015   Right ovary removed.  Benign cyst.   POLYPECTOMY  2008,11/07/13   Removal of polyps from uterine. Vernie Ammons)   SALPINGECTOMY  11/07/2013   Right.Marland KitchenMarland KitchenHemorrhagic cyst   Sonohysterogram  05/07/2007   [facility]   Family History  Problem Relation Age of Onset   Hyperlipidemia Mother    Hypertension Mother    Diabetes Mother    Osteoarthritis Mother    Hypothyroidism Mother    Alcohol abuse Father    Anuerysm  Paternal Grandmother    Breast cancer Maternal Aunt 61   Breast cancer Cousin 30   Ovarian cancer Other 50   Allergies  Allergen Reactions   Ciprofloxacin Swelling    Cipro   Septra [Sulfamethoxazole-Trimethoprim] Swelling      Review of Systems  Constitutional:  Positive for chills and fever. Negative for malaise/fatigue.  HENT:  Positive for congestion, ear pain and sinus pain.   Respiratory:  Positive for cough, sputum production and shortness of breath.   Cardiovascular:  Negative for chest pain.  Musculoskeletal:  Positive for myalgias.  Neurological:  Positive for headaches.      Objective:     BP  136/82   Pulse 95   Temp 100 F (37.8 C) (Temporal)   Ht '5\' 6"'$  (1.676 m)   Wt 159 lb (72.1 kg)   LMP 01/26/2018   SpO2 99%   BMI 25.66 kg/m  BP Readings from Last 3 Encounters:  06/01/22 136/82  05/26/22 136/88  03/29/22 122/80   Wt Readings from Last 3 Encounters:  06/01/22 159 lb (72.1 kg)  05/26/22 159 lb 3.2 oz (72.2 kg)  03/29/22 157 lb 6.4 oz (71.4 kg)      Physical Exam Vitals reviewed.  Constitutional:      Appearance: She is ill-appearing.  HENT:     Right Ear: Tympanic membrane, ear canal and external ear normal.     Left Ear: Tympanic membrane, ear canal and external ear normal.     Nose: Congestion present.     Right Sinus: Frontal sinus tenderness present. No maxillary sinus tenderness.     Left Sinus: Frontal sinus tenderness present. No maxillary sinus tenderness.  Cardiovascular:     Rate and Rhythm: Regular rhythm. Tachycardia present.     Pulses: Normal pulses.     Heart sounds: Normal heart sounds.  Pulmonary:     Effort: Pulmonary effort is normal.     Breath sounds: Normal breath sounds. No wheezing.  Skin:    General: Skin is warm.  Neurological:     Mental Status: She is alert and oriented to person, place, and time.  Psychiatric:        Mood and Affect: Mood normal.        Behavior: Behavior normal.      No results found for any visits on 06/01/22.     The 10-year ASCVD risk score (Arnett DK, et al., 2019) is: 1.2%    Assessment & Plan:   Problem List Items Addressed This Visit       Other   Acute cough - Primary    Differentials include sinusitis, community acquired pneumonia. Given presentation and physiological symptoms such as tachycardia and fever on exam, will go ahead and treat.  Augmentin 875-125 mg 1 tablet by mouth twice daily for seven days. Prescription sent.   Tussinonex 5 ml at bedtime as needed for cough. Prescription sent.   Recommend close follow up with PCP.       Relevant Medications    amoxicillin-clavulanate (AUGMENTIN) 875-125 MG tablet   chlorpheniramine-HYDROcodone (TUSSIONEX) 10-8 MG/5ML    No follow-ups on file.    Tinnie Gens, BSN-RN, DNP STUDENT

## 2022-06-01 NOTE — Telephone Encounter (Signed)
Pt evaluated by [facility] - Dr Volanda Napoleon aware

## 2022-06-01 NOTE — Progress Notes (Signed)
Subjective:    Patient ID: Pam Hamilton, female    DOB: Oct 03, 1965, 57 y.o.   MRN: [id number]  HPI  Pam Hamilton is a very pleasant 57 y.o. female patient of Dr. Volanda Napoleon has a past medical history of Abnormal bleeding in menstrual cycle (01/17/2017),Menopausal hot flushes (01/17/2017), Multiple sclerosis ([place]), Shingles, and Urine incontinence (2015). who presents today to discuss nasal congestion.   Evaluated by Thurston Hole, NP at [facility] for a "1-4 week history of cough, ear congestion, headaches, post nasal drip, rhinorrhea, wheezing". Was treated for acute bronchitis with a five day course of prednisone 20 mg and Tessalon Perles 100 mg TID PRN.   She sent a message via MyChart yesterday reporting worsening symptoms including facial upper dental pain so she was advised to schedule a follow up visit.   Symptoms today include persistent cough, headaches, head pressure, fatigue, chest pain under the breast from coughing. Symptoms originally began around Christmas 2023. Over the last 10 days her symptoms have progressed. Her most bothersome symptom is facial pain and sinus pressure. She completed her prednisone and Tessalon Perles without improvement. She's also tried Sudafed and Nyquil without improvement.   She tested negative for Covid-19 on 05/26/22.    BP Readings from Last 3 Encounters:  06/01/22 136/82  05/26/22 136/88  03/29/22 122/80     Review of Systems  Constitutional:  Positive for chills, fatigue and fever.  HENT:  Positive for congestion, postnasal drip and sinus pressure.   Respiratory:  Positive for cough.   Neurological:  Positive for headaches.         Past Medical History:  Diagnosis Date   Abnormal bleeding in menstrual cycle 01/17/2017   Annual physical exam    Depression    1997   Encounter to establish care 06/23/2014   Endometrial polyp    Family history of breast cancer 03/2018   cancer genetic testing letter sent; neg testing  per pt (no details/date in chart)   Family history of ovarian cancer    First degree hemorrhoids    Menopausal hot flushes 01/17/2017   Multiple sclerosis ([place])    Numbness and tingling of right arm 01/06/2014   Routine general medical examination at a health care facility 06/23/2014   Shingles    right upper arm ? year 2015 or prior    Urine incontinence 2015   2 Episodes     Social History   Socioeconomic History   Marital status: Married    Spouse name: Not on file   Number of children: Not on file   Years of education: Not on file   Highest education level: Not on file  Occupational History   Not on file  Tobacco Use   Smoking status: Never   Smokeless tobacco: Never  Vaping Use   Vaping Use: Never used  Substance and Sexual Activity   Alcohol use: Not Currently    Alcohol/week: 1.0 standard drink of alcohol    Types: 1 Glasses of wine per week    Comment: Once a month    Drug use: No   Sexual activity: Yes    Birth control/protection: None  Other Topics Concern   Not on file  Social History Narrative   Lives in [place] with Husband   2 children 20 and 17 both boys   1 Cat and 1 Rabbit   [employer]   Enjoys attending church, being with her boys       Social  Determinants of Health   Financial Resource Strain: Not on file  Food Insecurity: Not on file  Transportation Needs: Not on file  Physical Activity: Not on file  Stress: Not on file  Social Connections: Not on file  Intimate Partner Violence: Not on file    Past Surgical History:  Procedure Laterality Date   BREAST CYST ASPIRATION     neg   CESAREAN SECTION     COLONOSCOPY WITH PROPOFOL N/A 11/18/2016   Procedure: COLONOSCOPY WITH PROPOFOL;  Surgeon: Jonathon Bellows, MD;  Location: [facility];  Service: Endoscopy;  Laterality: N/A;   INTRAUTERINE DEVICE (IUD) INSERTION  01/02/2014   Mirena   OVARY SURGERY  6/252015   Right ovary removed.  Benign cyst.   POLYPECTOMY   2008,11/07/13   Removal of polyps from uterine. Vernie Ammons)   SALPINGECTOMY  11/07/2013   Right.Marland KitchenMarland KitchenHemorrhagic cyst   Sonohysterogram  05/07/2007   [facility]    Family History  Problem Relation Age of Onset   Hyperlipidemia Mother    Hypertension Mother    Diabetes Mother    Osteoarthritis Mother    Hypothyroidism Mother    Alcohol abuse Father    Anuerysm Paternal Grandmother    Breast cancer Maternal Aunt 1   Breast cancer Cousin 80   Ovarian cancer Other 50    Allergies  Allergen Reactions   Ciprofloxacin Swelling    Cipro   Septra [Sulfamethoxazole-Trimethoprim] Swelling    Current Outpatient Medications on File Prior to Visit  Medication Sig Dispense Refill   Cholecalciferol (VITAMIN D) 2000 units CAPS Take 1,000 Units by mouth daily.     Dimethyl Fumarate 240 MG CPDR TAKE 1 CAPSULE (240 MG TOTAL) BY MOUTH 2 (TWO) TIMES DAILY. 180 capsule 1   FeFum-FePoly-FA-B Cmp-C-Biot (INTEGRA PLUS) CAPS Take 1 capsule by mouth daily. 90 capsule 3   loratadine (CLARITIN) 10 MG tablet Take 10 mg by mouth daily.     Multiple Vitamin (MULTIVITAMIN) tablet Take 1 tablet by mouth daily.     No current facility-administered medications on file prior to visit.    BP 136/82   Pulse 95   Temp 100 F (37.8 C) (Temporal)   Ht '5\' 6"'$  (1.676 m)   Wt 159 lb (72.1 kg)   LMP 01/26/2018   SpO2 99%   BMI 25.66 kg/m  Objective:   Physical Exam Constitutional:      Appearance: She is ill-appearing.  HENT:     Right Ear: Tympanic membrane and ear canal normal.     Left Ear: Tympanic membrane and ear canal normal.     Nose:     Right Sinus: No maxillary sinus tenderness or frontal sinus tenderness.     Left Sinus: No maxillary sinus tenderness or frontal sinus tenderness.     Mouth/Throat:     Pharynx: No posterior oropharyngeal erythema.  Eyes:     Conjunctiva/sclera: Conjunctivae normal.  Cardiovascular:     Rate and Rhythm: Normal rate and regular rhythm.  Pulmonary:     Effort:  Pulmonary effort is normal.     Breath sounds: Normal breath sounds. No wheezing or rales.     Comments: Congested cough noted during visit Musculoskeletal:     Cervical back: Neck supple.  Lymphadenopathy:     Cervical: No cervical adenopathy.  Skin:    General: Skin is warm and dry.           Assessment & Plan:  Acute cough Assessment & Plan: Differentials include sinusitis, community acquired pneumonia.  Given presentation and physiological symptoms such as tachycardia and fever on exam, will go ahead and treat.  Augmentin 875-125 mg 1 tablet by mouth twice daily for seven days. Prescription sent.  No abnormal breath sounds on exam so will defer chest xray.  Tussinonex 5 ml at bedtime as needed for cough. Prescription sent.   Recommend close follow up with PCP.   I evaluated patient, was consulted regarding treatment, and agree with assessment and plan per Tinnie Gens, RN, DNP student.   Allie Bossier, NP-C   Orders: -     Amoxicillin-Pot Clavulanate; Take 1 tablet by mouth 2 (two) times daily.  Dispense: 14 tablet; Refill: 0 -     Hydrocod Poli-Chlorphe Poli ER; Take 5 mLs by mouth at bedtime as needed for cough.  Dispense: 50 mL; Refill: 0        Pleas Koch, NP

## 2022-06-03 ENCOUNTER — Other Ambulatory Visit (HOSPITAL_COMMUNITY): Payer: Self-pay

## 2022-06-03 ENCOUNTER — Ambulatory Visit: Payer: 59 | Attending: Family Medicine | Admitting: Pharmacist

## 2022-06-03 DIAGNOSIS — Z79899 Other long term (current) drug therapy: Secondary | ICD-10-CM

## 2022-06-03 NOTE — Progress Notes (Signed)
S: Patient presents today for review of their specialty medication.    Patient is currently taking Tecfidera (dimethyl fumarate) for MS. Patient is managed by Dr. Melrose Nakayama for this.    Adherence: confirms.    Efficacy: pt is happy with his results   Dosing: 240 mg BID   Renal adjustment: no adjustment necessary   Hepatic adjustment: no adjustment necessary   Dose adjustment for toxicity:  Flushing, GI intolerance, or intolerance to maintenance dose: Consider temporary dose reduction to 120 mg twice daily (resume recommended maintenance dose of 240 mg twice daily within 4 weeks). Consider discontinuation in patients who cannot tolerate return to the maintenance dose.   Hepatic injury (suspected drug-induced), clinically significant: Discontinue treatment.   Lymphocyte count <500/mm3 persisting for >6 months: Consider treatment interruption.   Serious infection: Consider withholding treatment until infection resolves.   Current adverse effects: Flushing, skin rash, or puritus: none since changing manufacturers.  S/sx of infection: none  GI upset: none  S/sx of heptotoxicity: none  O:  CMP     Component Value Date/Time   NA 139 03/29/2022 1033   NA 138 07/19/2012 1435   K 4.4 03/29/2022 1033   K 4.1 07/19/2012 1435   CL 101 03/29/2022 1033   CL 104 07/19/2012 1435   CO2 30 03/29/2022 1033   CO2 28 07/19/2012 1435   GLUCOSE 97 03/29/2022 1033   GLUCOSE 82 07/19/2012 1435   BUN 15 03/29/2022 1033   BUN 11 07/19/2012 1435   CREATININE 0.64 03/29/2022 1033   CREATININE 0.76 07/19/2012 1435   CALCIUM 9.8 03/29/2022 1033   CALCIUM 8.8 07/19/2012 1435   PROT 7.8 03/29/2022 1033   ALBUMIN 4.9 03/29/2022 1033   AST 18 03/29/2022 1033   ALT 19 03/29/2022 1033   ALKPHOS 86 03/29/2022 1033   BILITOT 0.6 03/29/2022 1033   GFRNONAA >60 02/11/2020 0748   GFRNONAA >60 07/19/2012 1435   GFRAA >60 02/11/2020 0748   GFRAA >60 07/19/2012 1435   CBC    Component Value Date/Time    WBC 5.4 03/29/2022 1033   RBC 4.05 03/29/2022 1033   HGB 12.9 03/29/2022 1033   HGB 11.8 (L) 10/28/2013 1128   HCT 37.7 03/29/2022 1033   HCT 34.5 (L) 10/28/2013 1128   PLT 308.0 03/29/2022 1033   PLT 243 10/28/2013 1128   MCV 93.1 03/29/2022 1033   MCV 92 10/28/2013 1128   MCH 32.1 02/11/2020 0748   MCHC 34.1 03/29/2022 1033   RDW 12.3 03/29/2022 1033   RDW 12.2 10/28/2013 1128   LYMPHSABS 1.7 03/29/2022 1033   LYMPHSABS 2.0 07/19/2012 1435   MONOABS 0.5 03/29/2022 1033   MONOABS 0.6 07/19/2012 1435   EOSABS 0.1 03/29/2022 1033   EOSABS 0.1 07/19/2012 1435   BASOSABS 0.0 03/29/2022 1033   BASOSABS 0.0 07/19/2012 1435   A/P:  A/P: 1. Medication review: Patient is currently on Tecfidera for MS and is tolerating it well. Reviewed the medication with the patient, including the following: dimethyl fumarate activates the Nrf2 pathway, which is believed to result in anti-inflammatory and cytoprotective properties. The medication is oral and should be swallowed whole. Administering with a high-fat, high-protein meal may decrease flushing and GI side effects. Possible adverse effects include skin flushing, pruritus, GI upset, albuminura, infection, lymphocytopenia, and increased liver transaminases. Cases of PML have been reported with severe, long-standing lymphopenia identified as the primary risk for PML. Dose adjustments for toxicities have been summarized above.    Benard Halsted, PharmD, BCACP,  [place] [phone]

## 2022-06-06 ENCOUNTER — Other Ambulatory Visit (HOSPITAL_COMMUNITY): Payer: Self-pay

## 2022-06-16 ENCOUNTER — Ambulatory Visit
Admission: RE | Admit: 2022-06-16 | Discharge: 2022-06-16 | Disposition: A | Discharge status: Home / Self Care | Payer: 59 | Source: Ambulatory Visit | Attending: Nurse Practitioner | Admitting: Nurse Practitioner

## 2022-06-16 ENCOUNTER — Encounter: Payer: Self-pay | Admitting: Nurse Practitioner

## 2022-06-16 ENCOUNTER — Ambulatory Visit (INDEPENDENT_AMBULATORY_CARE_PROVIDER_SITE_OTHER): Payer: 59 | Admitting: Nurse Practitioner

## 2022-06-16 VITALS — BP 130/70 | HR 89 | Temp 98.1°F | Ht 66.0 in | Wt 158.7 lb

## 2022-06-16 DIAGNOSIS — R0781 Pleurodynia: Secondary | ICD-10-CM | POA: Insufficient documentation

## 2022-06-16 DIAGNOSIS — R079 Chest pain, unspecified: Secondary | ICD-10-CM | POA: Insufficient documentation

## 2022-06-16 NOTE — Patient Instructions (Addendum)
We will do chest X-ray. Will call patient with the result. Alternate hot and cold pack. Use a support or pillow against the chest while coughing to support the muscles.

## 2022-06-16 NOTE — Progress Notes (Signed)
Established Patient Office Visit  Subjective:  Patient ID: Pam Hamilton, female    DOB: [birth year]  Age: 57 y.o. MRN: [id number]  CC:  Chief Complaint  Patient presents with   Pain    Under left breast     HPI  Pam Hamilton presents for pain under the left breast near the rib cages. She describes the pain as pressure  and worse at night. She can feel the pain with deep breaths, coughing  and when lying down on left side.   Patient also states that initially the pain was on both sides right and left  but after taking antibiotic the pain subsided for few days and came back on the left side.  She completed the course of Augmentin last week.    HPI   Past Medical History:  Diagnosis Date   Abnormal bleeding in menstrual cycle 01/17/2017   Annual physical exam    Depression    1997   Encounter to establish care 06/23/2014   Endometrial polyp    Family history of breast cancer 03/2018   cancer genetic testing letter sent; neg testing per pt (no details/date in chart)   Family history of ovarian cancer    First degree hemorrhoids    Menopausal hot flushes 01/17/2017   Multiple sclerosis ([place])    Numbness and tingling of right arm 01/06/2014   Routine general medical examination at a health care facility 06/23/2014   Shingles    right upper arm ? year 2015 or prior    Urine incontinence 2015   2 Episodes     Past Surgical History:  Procedure Laterality Date   BREAST CYST ASPIRATION     neg   CESAREAN SECTION     COLONOSCOPY WITH PROPOFOL N/A 11/18/2016   Procedure: COLONOSCOPY WITH PROPOFOL;  Surgeon: Pam Bellows, MD;  Location: [facility] ENDOSCOPY;  Service: Endoscopy;  Laterality: N/A;   INTRAUTERINE DEVICE (IUD) INSERTION  01/02/2014   Mirena   OVARY SURGERY  6/252015   Right ovary removed.  Benign cyst.   POLYPECTOMY  2008,11/07/13   Removal of polyps from uterine. Pam Hamilton)   SALPINGECTOMY  11/07/2013   Right.Pam KitchenMarland KitchenHemorrhagic cyst   Sonohysterogram   05/07/2007   Pam Hamilton    Family History  Problem Relation Age of Onset   Hyperlipidemia Mother    Hypertension Mother    Diabetes Mother    Osteoarthritis Mother    Hypothyroidism Mother    Alcohol abuse Father    Anuerysm Paternal Grandmother    Breast cancer Maternal Aunt 42   Breast cancer Cousin 36   Ovarian cancer Other 62    Social History   Socioeconomic History   Marital status: Married    Spouse name: Not on file   Number of children: Not on file   Years of education: Not on file   Highest education level: Not on file  Occupational History   Not on file  Tobacco Use   Smoking status: Never   Smokeless tobacco: Never  Vaping Use   Vaping Use: Never used  Substance and Sexual Activity   Alcohol use: Not Currently    Alcohol/week: 1.0 standard drink of alcohol    Types: 1 Glasses of wine per week    Comment: Once a month    Drug use: No   Sexual activity: Yes    Birth control/protection: None  Other Topics Concern   Not on file  Social History Narrative   Lives in [place] with  Husband   2 children 48 and 26 both boys   1 Cat and 1 Rabbit   [employer] Tech   Enjoys attending church, being with her boys       Social Determinants of Health   Financial Resource Strain: Not on file  Food Insecurity: Not on file  Transportation Needs: Not on file  Physical Activity: Not on file  Stress: Not on file  Social Connections: Not on file  Intimate Partner Violence: Not on file     Outpatient Medications Prior to Visit  Medication Sig Dispense Refill   Cholecalciferol (VITAMIN D) 2000 units CAPS Take 1,000 Units by mouth daily.     Dimethyl Fumarate 240 MG CPDR TAKE 1 CAPSULE (240 MG TOTAL) BY MOUTH 2 (TWO) TIMES DAILY. 180 capsule 1   FeFum-FePoly-FA-B Cmp-C-Biot (INTEGRA PLUS) CAPS Take 1 capsule by mouth daily. 90 capsule 3   loratadine (CLARITIN) 10 MG tablet Take 10 mg by mouth daily.     Multiple Vitamin (MULTIVITAMIN) tablet Take 1  tablet by mouth daily.     amoxicillin-clavulanate (AUGMENTIN) 875-125 MG tablet Take 1 tablet by mouth 2 (two) times daily. (Patient not taking: Reported on 06/16/2022) 14 tablet 0   chlorpheniramine-HYDROcodone (TUSSIONEX) 10-8 MG/5ML Take 5 mLs by mouth at bedtime as needed for cough. (Patient not taking: Reported on 06/16/2022) 50 mL 0   No facility-administered medications prior to visit.    Allergies  Allergen Reactions   Ciprofloxacin Swelling    Cipro   Septra [Sulfamethoxazole-Trimethoprim] Swelling    ROS Review of Systems  Constitutional: Negative.   HENT: Negative.    Respiratory:  Positive for cough.        Left lateral chest pain.  Cardiovascular: Negative.   Gastrointestinal: Negative.   Skin: Negative.   Neurological: Negative.   Psychiatric/Behavioral: Negative.        Objective:    Physical Exam Constitutional:      Appearance: Normal appearance.  Cardiovascular:     Rate and Rhythm: Normal rate and regular rhythm.     Pulses: Normal pulses.     Heart sounds: Normal heart sounds.  Pulmonary:     Effort: Pulmonary effort is normal.     Breath sounds: Normal breath sounds.  Musculoskeletal:       Arms:     Comments: tenderness  Neurological:     Mental Status: She is alert.     BP 130/70   Pulse 89   Temp 98.1 F (36.7 C) (Oral)   Ht '5\' 6"'$  (1.676 m)   Wt 158 lb 11.2 oz (72 kg)   LMP 01/26/2018   SpO2 96%   BMI 25.61 kg/m  Wt Readings from Last 3 Encounters:  06/16/22 158 lb 11.2 oz (72 kg)  06/01/22 159 lb (72.1 kg)  05/26/22 159 lb 3.2 oz (72.2 kg)     Health Maintenance  Topic Date Due   COVID-19 Vaccine (7 - 2023-24 season) 06/17/2022 (Originally 04/11/2022)   Zoster Vaccines- Shingrix (1 of 2) 06/29/2022 (Originally 08/11/1985)   INFLUENZA VACCINE  08/14/2022 (Originally 12/14/2021)   MAMMOGRAM  05/21/2023   PAP SMEAR-Modifier  06/15/2024   COLONOSCOPY (Pts 45-73yr Insurance coverage will need to be confirmed)  11/19/2026    DTaP/Tdap/Td (3 - Td or Tdap) 02/05/2030   Hepatitis C Screening  Completed   HIV Screening  Completed   HPV VACCINES  Aged Out    There are no preventive care reminders to display for this patient.  Lab  Results  Component Value Date   TSH 2.82 03/29/2022   Lab Results  Component Value Date   WBC 5.4 03/29/2022   HGB 12.9 03/29/2022   HCT 37.7 03/29/2022   MCV 93.1 03/29/2022   PLT 308.0 03/29/2022   Lab Results  Component Value Date   NA 139 03/29/2022   K 4.4 03/29/2022   CO2 30 03/29/2022   GLUCOSE 97 03/29/2022   BUN 15 03/29/2022   CREATININE 0.64 03/29/2022   BILITOT 0.6 03/29/2022   ALKPHOS 86 03/29/2022   AST 18 03/29/2022   ALT 19 03/29/2022   PROT 7.8 03/29/2022   ALBUMIN 4.9 03/29/2022   CALCIUM 9.8 03/29/2022   ANIONGAP 9 02/11/2020   GFR 99.34 03/29/2022   Lab Results  Component Value Date   CHOL 162 03/21/2022   Lab Results  Component Value Date   HDL 82 03/21/2022   Lab Results  Component Value Date   LDLCALC 71 03/21/2022   Lab Results  Component Value Date   TRIG 47 03/21/2022   Lab Results  Component Value Date   CHOLHDL 2.0 03/21/2022   Lab Results  Component Value Date   HGBA1C 5.3 03/21/2022      Assessment & Plan:   Problem List Items Addressed This Visit       Other   Rib pain on left side   Relevant Orders   DG Chest 2 View (Completed)   CT Chest Wo Contrast   Left-sided chest pain - Primary    The x-ray shows small left effusion and minimal atelectatic changes. Will start her on Augmentin twice a day for 10 days. Will perform CT of the chest for further evaluation.      Relevant Orders   DG Chest 2 View (Completed)   CT Chest Wo Contrast     Meds ordered this encounter  Medications   amoxicillin-clavulanate (AUGMENTIN) 875-125 MG tablet    Sig: Take 1 tablet by mouth 2 (two) times daily.    Dispense:  20 tablet    Refill:  0     Follow-up: No follow-ups on file.    Theresia Lo, NP

## 2022-06-17 ENCOUNTER — Encounter: Payer: Self-pay | Admitting: Nurse Practitioner

## 2022-06-17 ENCOUNTER — Other Ambulatory Visit: Payer: Self-pay

## 2022-06-17 ENCOUNTER — Telehealth: Payer: Self-pay | Admitting: Nurse Practitioner

## 2022-06-17 ENCOUNTER — Telehealth: Payer: Self-pay | Admitting: Family Medicine

## 2022-06-17 DIAGNOSIS — R0781 Pleurodynia: Secondary | ICD-10-CM | POA: Insufficient documentation

## 2022-06-17 DIAGNOSIS — R079 Chest pain, unspecified: Secondary | ICD-10-CM | POA: Insufficient documentation

## 2022-06-17 MED ORDER — AMOXICILLIN-POT CLAVULANATE 875-125 MG PO TABS
1.0000 | ORAL_TABLET | Freq: Two times a day (BID) | ORAL | 0 refills | Status: DC
  Filled 2022-06-17: qty 20, 10d supply, fill #0

## 2022-06-17 NOTE — Telephone Encounter (Signed)
S/w pt - prefers to take abx and do rpt XRAY  Per our conversation, pt advised ok to do so.  Pt will call if changes her mind.  Will call when completes abx to sched xray.

## 2022-06-17 NOTE — Telephone Encounter (Signed)
LM for pt to cb 

## 2022-06-17 NOTE — Telephone Encounter (Signed)
Pt called in about her X-ray results from yesterday.

## 2022-06-17 NOTE — Assessment & Plan Note (Signed)
The x-ray shows small left effusion and minimal atelectatic changes. Will start her on Augmentin twice a day for 10 days. Will perform CT of the chest for further evaluation.

## 2022-06-17 NOTE — Telephone Encounter (Signed)
Please call the patient with the result of X- rays. The x rays shows some fluid buildup in the lungs. We will start her on Augmentin twice a day for 10 days to treat that and will perform CT of the chest for further evaluation. Please keep [place] posted regarding the symptoms.

## 2022-06-28 ENCOUNTER — Encounter: Payer: Self-pay | Admitting: Nurse Practitioner

## 2022-06-28 ENCOUNTER — Ambulatory Visit: Admission: RE | Admit: 2022-06-28 | Payer: 59 | Source: Home / Self Care

## 2022-06-29 ENCOUNTER — Ambulatory Visit
Admission: RE | Admit: 2022-06-29 | Discharge: 2022-06-29 | Disposition: A | Discharge status: Home / Self Care | Payer: 59 | Source: Ambulatory Visit | Attending: Nurse Practitioner | Admitting: Nurse Practitioner

## 2022-06-29 ENCOUNTER — Other Ambulatory Visit: Payer: Self-pay

## 2022-06-29 ENCOUNTER — Other Ambulatory Visit (HOSPITAL_COMMUNITY): Payer: Self-pay

## 2022-06-29 DIAGNOSIS — R051 Acute cough: Secondary | ICD-10-CM

## 2022-06-29 DIAGNOSIS — R059 Cough, unspecified: Secondary | ICD-10-CM | POA: Diagnosis not present

## 2022-07-07 ENCOUNTER — Other Ambulatory Visit (HOSPITAL_COMMUNITY): Payer: Self-pay

## 2022-07-07 NOTE — Progress Notes (Signed)
The X rays is normal. How is the chest pain now??

## 2022-07-07 NOTE — Progress Notes (Signed)
Noted  

## 2022-07-14 NOTE — Telephone Encounter (Signed)
Duplicate encounter. See previous.

## 2022-07-18 ENCOUNTER — Other Ambulatory Visit: Payer: Self-pay

## 2022-07-18 DIAGNOSIS — R202 Paresthesia of skin: Secondary | ICD-10-CM | POA: Diagnosis not present

## 2022-07-18 DIAGNOSIS — R200 Anesthesia of skin: Secondary | ICD-10-CM | POA: Diagnosis not present

## 2022-07-18 DIAGNOSIS — G35 Multiple sclerosis: Secondary | ICD-10-CM | POA: Diagnosis not present

## 2022-07-18 DIAGNOSIS — M79671 Pain in right foot: Secondary | ICD-10-CM | POA: Diagnosis not present

## 2022-07-18 MED ORDER — DIMETHYL FUMARATE 240 MG PO CPDR: 240.0000 mg | DELAYED_RELEASE_CAPSULE | Freq: Two times a day (BID) | ORAL | 1 refills | Status: DC

## 2022-07-19 ENCOUNTER — Other Ambulatory Visit: Payer: Self-pay

## 2022-07-20 ENCOUNTER — Other Ambulatory Visit: Payer: Self-pay

## 2022-07-21 ENCOUNTER — Other Ambulatory Visit: Payer: Self-pay

## 2022-07-21 MED ORDER — GABAPENTIN 100 MG PO CAPS
100.0000 mg | ORAL_CAPSULE | Freq: Two times a day (BID) | ORAL | 3 refills | Status: DC
  Filled 2022-07-21: qty 60, 30d supply, fill #0

## 2022-07-22 ENCOUNTER — Other Ambulatory Visit: Payer: Self-pay

## 2022-07-28 ENCOUNTER — Other Ambulatory Visit (HOSPITAL_COMMUNITY): Payer: Self-pay

## 2022-08-03 ENCOUNTER — Other Ambulatory Visit (HOSPITAL_COMMUNITY): Payer: Self-pay

## 2022-08-26 ENCOUNTER — Other Ambulatory Visit: Payer: Self-pay

## 2022-08-30 ENCOUNTER — Other Ambulatory Visit (HOSPITAL_COMMUNITY): Payer: Self-pay

## 2022-09-02 ENCOUNTER — Other Ambulatory Visit: Payer: Self-pay

## 2022-09-30 ENCOUNTER — Other Ambulatory Visit: Payer: Self-pay | Admitting: Pharmacist

## 2022-09-30 ENCOUNTER — Other Ambulatory Visit (HOSPITAL_COMMUNITY): Payer: Self-pay

## 2022-09-30 ENCOUNTER — Other Ambulatory Visit: Payer: Self-pay

## 2022-09-30 MED ORDER — DIMETHYL FUMARATE 240 MG PO CPDR
240.0000 mg | DELAYED_RELEASE_CAPSULE | Freq: Two times a day (BID) | ORAL | 1 refills | Status: DC
  Filled 2022-09-30: qty 60, 30d supply, fill #0
  Filled 2022-10-28: qty 60, 30d supply, fill #1
  Filled 2022-12-06: qty 60, 30d supply, fill #2
  Filled 2023-01-09: qty 60, 30d supply, fill #3
  Filled 2023-01-31: qty 60, 30d supply, fill #4
  Filled 2023-03-13: qty 60, 30d supply, fill #5

## 2022-10-05 ENCOUNTER — Other Ambulatory Visit (HOSPITAL_COMMUNITY): Payer: Self-pay

## 2022-10-28 ENCOUNTER — Other Ambulatory Visit (HOSPITAL_COMMUNITY): Payer: Self-pay

## 2022-11-08 ENCOUNTER — Other Ambulatory Visit (HOSPITAL_COMMUNITY): Payer: Self-pay

## 2022-12-06 ENCOUNTER — Other Ambulatory Visit (HOSPITAL_COMMUNITY): Payer: Self-pay

## 2022-12-14 ENCOUNTER — Other Ambulatory Visit: Payer: Self-pay

## 2022-12-26 ENCOUNTER — Encounter: Payer: Self-pay | Admitting: Family Medicine

## 2022-12-26 DIAGNOSIS — Z1231 Encounter for screening mammogram for malignant neoplasm of breast: Secondary | ICD-10-CM

## 2023-01-04 ENCOUNTER — Other Ambulatory Visit (HOSPITAL_COMMUNITY): Payer: Self-pay

## 2023-01-06 ENCOUNTER — Other Ambulatory Visit (HOSPITAL_COMMUNITY): Payer: Self-pay

## 2023-01-09 ENCOUNTER — Other Ambulatory Visit: Payer: Self-pay

## 2023-01-11 ENCOUNTER — Other Ambulatory Visit (HOSPITAL_COMMUNITY): Payer: Self-pay

## 2023-01-17 ENCOUNTER — Ambulatory Visit (INDEPENDENT_AMBULATORY_CARE_PROVIDER_SITE_OTHER): Payer: 59

## 2023-01-17 VITALS — BP 124/76 | HR 78 | Ht 66.0 in | Wt 154.2 lb

## 2023-01-17 DIAGNOSIS — Z01419 Encounter for gynecological examination (general) (routine) without abnormal findings: Secondary | ICD-10-CM | POA: Diagnosis not present

## 2023-01-17 NOTE — Progress Notes (Signed)
Outpatient Gynecology Note: Annual Visit  Assessment/Plan:    Pam Hamilton is a 57 y.o. female [phone] with normal well-woman gynecologic exam.   Well woman exam - Reviewed health maintenance topics as documented below. - Pap smear UTD, NILM/HPV neg in 2023, due in 2028. - Mammogram due, order placed. - Declines STI screening. - TVUS ordered for history of fibroids and new symptoms of cramping.  - TSH collected today due to hair loss and episodes of sweating.     Orders Placed This Encounter  Procedures   US PELVIS TRANSVAGINAL NON-OB (TV ONLY)    Standing Status:   Future    Standing Expiration Date:   07/17/2023    Order Specific Question:   Reason for exam:    Answer:   cramping and history of fibroids    Order Specific Question:   Preferred imaging location?    Answer:   Internal   MM 3D SCREENING MAMMOGRAM BILATERAL BREAST    Standing Status:   Future    Standing Expiration Date:   01/17/2024    Order Specific Question:   Reason for Exam (SYMPTOM  OR DIAGNOSIS REQUIRED)    Answer:   routine screening    Order Specific Question:   Is the patient pregnant?    Answer:   No    Order Specific Question:   Preferred imaging location?    Answer:   [facility]   TSH   Current Outpatient Medications  Medication Instructions   Dimethyl Fumarate 240 mg, Oral, 2 times daily   FeFum-FePoly-FA-B Cmp-C-Biot (INTEGRA PLUS) CAPS 1 capsule, Oral, Daily   gabapentin (NEURONTIN) 100 MG capsule Take 1 capsule (100 mg total) by mouth 1 to 2 times daily.   Multiple Vitamin (MULTIVITAMIN) tablet 1 tablet, Oral, Daily   Vitamin D 1,000 Units, Oral, Daily    Return in about 1 year (around 01/17/2024) for Annual physical.    Subjective:    Pam Hamilton is a 57 y.o. female G2P2002 who presents for annual wellness visit.   Occupation retired    Lives with husband    CONCERNS? Has been in menopause for several years but has noted in past 6 months has noticed some  episodes of sweating, not as pronounced as with hot flashes. No vaginal bleeding. Also occasional cramping at times.   Right ovary removed, several polyps, history of endometriosis.  Well Woman Visit:  GYN HISTORY:  Patient's last menstrual period was 01/26/2018.     Menstrual History: OB History     Gravida  2   Para  2   Term  2   Preterm      AB      Living  2      SAB      IAB      Ectopic      Multiple      Live Births             Patient's last menstrual period was 01/26/2018.   Urinary incontinence? Occasionally, feels it is related to MS  Sexually active: no Last pap: 2023, NILM/HPV neg History of abnormal Pap: no Gardasil series:  n/a STI history: no Contraceptive methods: menopause.  Health Maintenance > Reviewed breast self-awareness, history of breast cancer in aunt and cousins, BRCA testing negative previously.  > History of abnormal mammogram: No, cystic breast, had one biopsy about 20 years ago, no malignancy > Exercise: aerobics and yoga, pilates , very active > Dietary Supplements:  no > Body mass index is 24.89 kg/m.  > Recent dental visit Yes.   > Seat Belt Use: Yes.   > Texting and driving? No. > Guns in the house Yes.  , stored in safe place with lock > STI/HIV testing or immunizations needed? No. > Concern for alcohol abuse? one   Tobacco or other drug use: denied. Tobacco Use: Low Risk  (01/17/2023)   Patient History    Smoking Tobacco Use: Never    Smokeless Tobacco Use: Never    Passive Exposure: Not on file     PHQ-2 Score: In last two weeks, how often have you felt: Little interest or pleasure in doing things: Not at all (0), one day Feeling down, depressed or hopeless: Not at all (0) Score: 0  GAD-2 Over the last 2 weeks, how often have you been bothered by the following problems? Feeling nervous, anxious or on edge: Not at all (0), one day Not being able to stop or control worrying: Not at all (0)} Score:  0   If >50:  Last mammogram:  BIRADS1 in 2023 Age at menopause: 44 Last colonoscopy:  at age 57  Last lipid screening: 2023 Hep C Screening: negative  _________________________________________________________  Current Outpatient Medications  Medication Sig Dispense Refill   Cholecalciferol (VITAMIN D) 2000 units CAPS Take 1,000 Units by mouth daily.     Dimethyl Fumarate 240 MG CPDR Take 1 capsule (240 mg total) by mouth 2 (two) times daily. 180 capsule 1   FeFum-FePoly-FA-B Cmp-C-Biot (INTEGRA PLUS) CAPS Take 1 capsule by mouth daily. 90 capsule 3   gabapentin (NEURONTIN) 100 MG capsule Take 1 capsule (100 mg total) by mouth 1 to 2 times daily. 60 capsule 3   Multiple Vitamin (MULTIVITAMIN) tablet Take 1 tablet by mouth daily.     No current facility-administered medications for this visit.   Allergies  Allergen Reactions   Ciprofloxacin Swelling    Cipro   Septra [Sulfamethoxazole-Trimethoprim] Swelling    Past Medical History:  Diagnosis Date   Abnormal bleeding in menstrual cycle 01/17/2017   Annual physical exam    Depression    1997   Encounter to establish care 06/23/2014   Endometrial polyp    Family history of breast cancer 03/2018   cancer genetic testing letter sent; neg testing per pt (no details/date in chart)   Family history of ovarian cancer    First degree hemorrhoids    Menopausal hot flushes 01/17/2017   Multiple sclerosis ([facility])    Numbness and tingling of right arm 01/06/2014   Routine general medical examination at a health care facility 06/23/2014   Shingles    right upper arm ? year 2015 or prior    Urine incontinence 2015   2 Episodes    Past Surgical History:  Procedure Laterality Date   BREAST CYST ASPIRATION     neg   CESAREAN SECTION     COLONOSCOPY WITH PROPOFOL N/A 11/18/2016   Procedure: COLONOSCOPY WITH PROPOFOL;  Surgeon: Wyline Mood, MD;  Location: [facility] ENDOSCOPY;  Service: Endoscopy;  Laterality: N/A;   INTRAUTERINE DEVICE  (IUD) INSERTION  01/02/2014   Mirena   OVARY SURGERY  6/252015   Right ovary removed.  Benign cyst.   POLYPECTOMY  2008,11/07/13   Removal of polyps from uterine. Haskel Khan)   SALPINGECTOMY  11/07/2013   Right.Marland KitchenMarland KitchenHemorrhagic cyst   Sonohysterogram  05/07/2007   [facility]   OB History     Gravida  2   Para  2  Term  2   Preterm      AB      Living  2      SAB      IAB      Ectopic      Multiple      Live Births             Social History   Tobacco Use   Smoking status: Never   Smokeless tobacco: Never  Substance Use Topics   Alcohol use: Not Currently    Alcohol/week: 1.0 standard drink of alcohol    Types: 1 Glasses of wine per week    Comment: Once a month    Social History   Substance and Sexual Activity  Sexual Activity Yes   Birth control/protection: None    Immunization History  Administered Date(s) Administered   Influenza-Unspecified 02/13/2021   PFIZER(Purple Top)SARS-COV-2 Vaccination 05/07/2019, 05/28/2019, 03/12/2020, 10/09/2020, 04/19/2021   Tdap 09/08/2009, 02/06/2020   Unspecified SARS-COV-2 Vaccination 02/14/2022     Review Of Systems  Constitutional: Denied constitutional symptoms, night sweats, recent illness, fatigue, fever, insomnia and weight loss.  Eyes: Denied eye symptoms, eye pain, photophobia, vision change and visual disturbance.  Ears/Nose/Throat/Neck: Denied ear, nose, throat or neck symptoms, hearing loss, nasal discharge, sinus congestion and sore throat.  Cardiovascular: Denied cardiovascular symptoms, arrhythmia, chest pain/pressure, edema, exercise intolerance, orthopnea and palpitations.  Respiratory: Denied pulmonary symptoms, asthma, pleuritic pain, productive sputum, cough, dyspnea and wheezing.  Gastrointestinal: Episodes of cramping.  Genitourinary: Denied genitourinary symptoms including symptomatic vaginal discharge, pelvic relaxation issues, and urinary complaints.  Musculoskeletal: Denied  musculoskeletal symptoms, stiffness, swelling, muscle weakness and myalgia.  Dermatologic: Denied dermatology symptoms, rash and scar.  Neurologic: Denied neurology symptoms, dizziness, headache, neck pain and syncope.  Psychiatric: Denied psychiatric symptoms, anxiety and depression.  Endocrine: Denied endocrine symptoms including hot flashes and night sweats.      Objective:    BP 124/76   Pulse 78   Ht 5\' 6"  (1.676 m)   Wt 154 lb 3.2 oz (69.9 kg)   LMP 01/26/2018   BMI 24.89 kg/m   Constitutional: Well-developed, well-nourished female in no acute distress Neurological: Alert and oriented to person, place, and time Psychiatric: Mood and affect appropriate Skin: No rashes or lesions Neck: Supple without masses. Trachea is midline.Thyroid is normal size without masses Lymphatics: No cervical, axillary, supraclavicular, or inguinal adenopathy noted Respiratory: Clear to auscultation bilaterally. Good air movement with normal work of breathing. Cardiovascular: Regular rate and rhythm. Extremities grossly normal, nontender with no edema; pulses regular Gastrointestinal: Soft, nontender, nondistended. No masses or hernias appreciated. No hepatosplenomegaly. No fluid wave. No rebound or guarding. Breast Exam: normal appearance, no masses or tenderness Genitourinary:         External Genitalia: Normal female genitalia    Vagina: hypo-rugated, no lesions.    Cervix: No lesions, normal size and consistency; no cervical motion tenderness     Uterus: Normal size and contour; smooth, mobile, NT, midposition. Adnexae: Non-palpable and non-tender Perineum/Anus: No lesions Rectal: deferred    Autumn Messing, CNM  01/17/23 9:59 AM

## 2023-01-17 NOTE — Assessment & Plan Note (Signed)
-   Reviewed health maintenance topics as documented below. - Pap smear UTD, NILM/HPV neg in 2023, due in 2028. - Mammogram due, order placed. - Declines STI screening. - TVUS ordered for history of fibroids and new symptoms of cramping.  - TSH collected today due to hair loss and episodes of sweating.

## 2023-01-18 LAB — TSH: TSH: 3.62 u[IU]/mL (ref 0.450–4.500)

## 2023-01-31 ENCOUNTER — Other Ambulatory Visit (HOSPITAL_COMMUNITY): Payer: Self-pay

## 2023-02-04 ENCOUNTER — Encounter (HOSPITAL_COMMUNITY): Payer: Self-pay

## 2023-02-06 ENCOUNTER — Ambulatory Visit: Payer: 59

## 2023-02-06 DIAGNOSIS — R102 Pelvic and perineal pain: Secondary | ICD-10-CM

## 2023-02-06 DIAGNOSIS — Z01419 Encounter for gynecological examination (general) (routine) without abnormal findings: Secondary | ICD-10-CM

## 2023-02-07 ENCOUNTER — Ambulatory Visit
Admission: RE | Admit: 2023-02-07 | Discharge: 2023-02-07 | Disposition: A | Discharge status: Home / Self Care | Payer: 59 | Source: Ambulatory Visit

## 2023-02-07 DIAGNOSIS — Z1231 Encounter for screening mammogram for malignant neoplasm of breast: Secondary | ICD-10-CM | POA: Insufficient documentation

## 2023-02-07 DIAGNOSIS — Z01419 Encounter for gynecological examination (general) (routine) without abnormal findings: Secondary | ICD-10-CM | POA: Insufficient documentation

## 2023-03-07 ENCOUNTER — Other Ambulatory Visit: Payer: Self-pay

## 2023-03-10 ENCOUNTER — Other Ambulatory Visit: Payer: Self-pay

## 2023-03-13 ENCOUNTER — Other Ambulatory Visit: Payer: Self-pay

## 2023-03-13 NOTE — Progress Notes (Signed)
Specialty Pharmacy Refill Coordination Note  Pam Hamilton is a 57 y.o. female contacted today regarding refills of specialty medication(s) Dimethyl Fumarate (Multiple Sclerosis Agents)   Patient requested Delivery   Delivery date: 03/21/23   Verified address: [address], [name], [phone]   Medication will be filled on 03/20/23.

## 2023-03-20 ENCOUNTER — Other Ambulatory Visit: Payer: Self-pay

## 2023-03-28 ENCOUNTER — Encounter: Payer: Self-pay | Admitting: Family Medicine

## 2023-03-29 ENCOUNTER — Other Ambulatory Visit: Payer: Self-pay | Admitting: Family Medicine

## 2023-03-29 DIAGNOSIS — R7989 Other specified abnormal findings of blood chemistry: Secondary | ICD-10-CM

## 2023-03-29 DIAGNOSIS — E538 Deficiency of other specified B group vitamins: Secondary | ICD-10-CM

## 2023-03-29 DIAGNOSIS — Z13220 Encounter for screening for lipoid disorders: Secondary | ICD-10-CM

## 2023-03-29 DIAGNOSIS — G35 Multiple sclerosis: Secondary | ICD-10-CM

## 2023-03-30 NOTE — Telephone Encounter (Signed)
Patient called and would like labs sent to [facility] so she can get her labs drawn tomorrow morning 11/15 prior to appt.

## 2023-03-31 ENCOUNTER — Telehealth: Payer: Self-pay

## 2023-03-31 NOTE — Telephone Encounter (Signed)
Called pt and she stated that she will just get blood work Monday when she is here for her CPE.

## 2023-03-31 NOTE — Telephone Encounter (Signed)
Patient states she is planning to go to the hospital to have her labs drawn this morning and wants to be sure the orders are in.  The orders are in for her to have her labs at our clinic.  I let patient know that I will send a message to Dr. Astrid Divine nurse to let them know she is requesting the change.  Patient states she would like for [place] to please let her know when the orders have been changed so she can go to the lab.  Patient states she is fasting and will need to eat before she goes to workout later this morning.

## 2023-03-31 NOTE — Telephone Encounter (Signed)
Called pt and she is having labs done here at the office the day of her cpe.

## 2023-04-03 ENCOUNTER — Ambulatory Visit (INDEPENDENT_AMBULATORY_CARE_PROVIDER_SITE_OTHER): Payer: 59 | Admitting: Family Medicine

## 2023-04-03 ENCOUNTER — Encounter: Payer: Self-pay | Admitting: Family Medicine

## 2023-04-03 VITALS — BP 118/66 | HR 70 | Temp 98.0°F | Resp 16 | Ht 66.0 in | Wt 152.4 lb

## 2023-04-03 DIAGNOSIS — R7989 Other specified abnormal findings of blood chemistry: Secondary | ICD-10-CM

## 2023-04-03 DIAGNOSIS — G35 Multiple sclerosis: Secondary | ICD-10-CM | POA: Diagnosis not present

## 2023-04-03 DIAGNOSIS — R7401 Elevation of levels of liver transaminase levels: Secondary | ICD-10-CM

## 2023-04-03 DIAGNOSIS — Z0000 Encounter for general adult medical examination without abnormal findings: Secondary | ICD-10-CM | POA: Diagnosis not present

## 2023-04-03 DIAGNOSIS — Z1231 Encounter for screening mammogram for malignant neoplasm of breast: Secondary | ICD-10-CM

## 2023-04-03 DIAGNOSIS — Z13220 Encounter for screening for lipoid disorders: Secondary | ICD-10-CM

## 2023-04-03 DIAGNOSIS — D259 Leiomyoma of uterus, unspecified: Secondary | ICD-10-CM

## 2023-04-03 DIAGNOSIS — E538 Deficiency of other specified B group vitamins: Secondary | ICD-10-CM

## 2023-04-03 DIAGNOSIS — D649 Anemia, unspecified: Secondary | ICD-10-CM | POA: Diagnosis not present

## 2023-04-03 DIAGNOSIS — R7309 Other abnormal glucose: Secondary | ICD-10-CM

## 2023-04-03 LAB — CBC
HCT: 38.3 % (ref 36.0–46.0)
Hemoglobin: 13 g/dL (ref 12.0–15.0)
MCHC: 34.1 g/dL (ref 30.0–36.0)
MCV: 95.4 fL (ref 78.0–100.0)
Platelets: 309 10*3/uL (ref 150.0–400.0)
RBC: 4.01 Mil/uL (ref 3.87–5.11)
RDW: 12.5 % (ref 11.5–15.5)
WBC: 5.2 10*3/uL (ref 4.0–10.5)

## 2023-04-03 LAB — COMPREHENSIVE METABOLIC PANEL
ALT: 29 U/L (ref 0–35)
AST: 42 U/L — ABNORMAL HIGH (ref 0–37)
Albumin: 4.7 g/dL (ref 3.5–5.2)
Alkaline Phosphatase: 85 U/L (ref 39–117)
BUN: 15 mg/dL (ref 6–23)
CO2: 28 meq/L (ref 19–32)
Calcium: 9.6 mg/dL (ref 8.4–10.5)
Chloride: 100 meq/L (ref 96–112)
Creatinine, Ser: 0.72 mg/dL (ref 0.40–1.20)
GFR: 93.32 mL/min (ref >60.00)
Glucose, Bld: 104 mg/dL — ABNORMAL HIGH (ref 70–99)
Potassium: 4.2 meq/L (ref 3.5–5.1)
Sodium: 139 meq/L (ref 135–145)
Total Bilirubin: 0.7 mg/dL (ref 0.2–1.2)
Total Protein: 7.7 g/dL (ref 6.0–8.3)

## 2023-04-03 LAB — HEMOGLOBIN A1C: Hgb A1c MFr Bld: 5.7 % (ref 4.6–6.5)

## 2023-04-03 LAB — LIPID PANEL
Cholesterol: 158 mg/dL (ref 0–200)
HDL: 73.1 mg/dL (ref >39.00)
LDL Cholesterol: 70 mg/dL (ref 0–99)
NonHDL: 84.78
Total CHOL/HDL Ratio: 2
Triglycerides: 73 mg/dL (ref 0.0–149.0)
VLDL: 14.6 mg/dL (ref 0.0–40.0)

## 2023-04-03 LAB — VITAMIN B12: Vitamin B-12: 674 pg/mL (ref 211–911)

## 2023-04-03 LAB — VITAMIN D 25 HYDROXY (VIT D DEFICIENCY, FRACTURES): VITD: 61.24 ng/mL (ref 30.00–100.00)

## 2023-04-03 NOTE — Patient Instructions (Addendum)
It was a pleasure meeting you today. Thank you for allowing me to take part in your health care.  Our goals for today as we discussed include:  We will get some labs today.  If they are abnormal or we need to do something about them, I will call you.  If they are normal, I will send you a message on MyChart (if it is active) or a letter in the mail.  If you don't hear from [place] in 2 weeks, please call the office at the number below.    This is a list of the screening recommended for you and due dates:  Health Maintenance  Topic Date Due   COVID-19 Vaccine (8 - 2023-24 season) 04/02/2023   Mammogram  02/06/2025   Pap with HPV screening  06/15/2026   Colon Cancer Screening  11/19/2026   DTaP/Tdap/Td vaccine (3 - Td or Tdap) 02/05/2030   Flu Shot  Completed   Hepatitis C Screening  Completed   HIV Screening  Completed   Zoster (Shingles) Vaccine  Completed   HPV Vaccine  Aged Out    Follow up as needed Annual in 1 year  If you have any questions or concerns, please do not hesitate to call the office at [phone].  I look forward to our next visit and until then take care and stay safe.  Regards,   Dana Allan, MD   [facility]

## 2023-04-03 NOTE — Progress Notes (Signed)
SUBJECTIVE:   Chief Complaint  Patient presents with   Annual Exam   HPI Presents for annual physical  Discussed the use of AI scribe software for clinical note transcription with the patient, who gave verbal consent to proceed.  History of Present Illness The patient, with a history of multiple sclerosis (MS), presented for a routine physical examination. She reported no new health concerns and has been maintaining an active lifestyle, attending various fitness classes at the local [employer] four to five times a week. The patient's last MS flare was noted to be in February of the previous year, which she attributed to work-related stress. Since then, she has retired and has not experienced any further flares.  The patient occasionally takes gabapentin for foot discomfort and is also on Integra Plus, fumarate, vitamin D, and a multivitamin. She is due for a follow-up with her neurologist in March.  The patient reported a recent issue with a fibroid, which was discovered during a routine OB-GYN visit due to reported cramping.  The patient also reported some issues with her glasses, particularly with the night vision coating. 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  The patient's vaccinations are up to date, and she has had her flu vaccine. The patient was fasting in preparation for these labs.    PERTINENT PMH / PSH: As above  OBJECTIVE:  BP 118/66   Pulse 70   Temp 98 F (36.7 C)   Resp 16   Ht 5\' 6"  (1.676 m)   Wt 152 lb 6 oz (69.1 kg)   LMP 01/26/2018   SpO2 99%   BMI 24.59 kg/m    Physical Exam Vitals reviewed.  Constitutional:      General: She is not in acute distress.    Appearance: She is not ill-appearing.  HENT:     Head: Normocephalic.     Right Ear: Tympanic membrane, ear canal and external ear normal.     Left Ear: Tympanic membrane, ear canal and external ear normal.     Nose: Nose normal.     Mouth/Throat:      Mouth: Mucous membranes are moist.  Eyes:     Extraocular Movements: Extraocular movements intact.     Conjunctiva/sclera: Conjunctivae normal.     Pupils: Pupils are equal, round, and reactive to light.  Neck:     Thyroid: No thyromegaly or thyroid tenderness.     Vascular: No carotid bruit.  Cardiovascular:     Rate and Rhythm: Normal rate and regular rhythm.     Pulses: Normal pulses.     Heart sounds: Normal heart sounds.  Pulmonary:     Effort: Pulmonary effort is normal.     Breath sounds: Normal breath sounds.  Abdominal:     General: Bowel sounds are normal. There is no distension.     Palpations: Abdomen is soft.     Tenderness: There is no abdominal tenderness. There is no right CVA tenderness, left CVA tenderness, guarding or rebound.  Musculoskeletal:        General: Normal range of motion.     Cervical back: Normal range of motion.     Right lower leg: No edema.     Left lower leg: No edema.  Lymphadenopathy:     Cervical: No cervical adenopathy.  Skin:    Capillary Refill: Capillary refill takes less than 2 seconds.  Neurological:     General: No focal  deficit present.     Mental Status: She is alert and oriented to person, place, and time. Mental status is at baseline.     Motor: No weakness.  Psychiatric:        Mood and Affect: Mood normal.        Behavior: Behavior normal.        Thought Content: Thought content normal.        Judgment: Judgment normal.        04/03/2023    9:27 AM 06/16/2022    3:39 PM 06/01/2022    2:57 PM 05/26/2022    1:31 PM 03/29/2022    9:55 AM  Depression screen PHQ 2/9  Decreased Interest 0 0 0 0 0  Down, Depressed, Hopeless 0 0 0 0 0  PHQ - 2 Score 0 0 0 0 0  Altered sleeping 0      Tired, decreased energy 1      Change in appetite 0      Feeling bad or failure about yourself  0      Trouble concentrating 0      Moving slowly or fidgety/restless 0      Suicidal thoughts 0      PHQ-9 Score 1      Difficult doing  work/chores Not difficult at all          04/03/2023    9:27 AM 06/15/2021    8:34 AM 02/06/2020    3:43 PM  GAD 7 : Generalized Anxiety Score  Nervous, Anxious, on Edge 0 1 0  Control/stop worrying 0 0 0  Worry too much - different things 0 0 0  Trouble relaxing 0 0 0  Restless 0 0 0  Easily annoyed or irritable 0 0 0  Afraid - awful might happen 0 0 0  Total GAD 7 Score 0 1 0  Anxiety Difficulty Not difficult at all Not difficult at all     ASSESSMENT/PLAN:  Annual physical exam Assessment & Plan: HCM up-to-date Mammogram due 2025. Strong family history of breast cancer, prefers yearly mammograms. Recommend regular self breast exams Pap due 2026, follows with OBGYN Colonoscopy due 2028 Tdap due 2031 Flu vaccine 10/24 at [facility] Check annual labs today [facility] [employer], including circuit training, Pilates, interval training, and yoga.     Vitamin B12 deficiency -     Vitamin B12  Low vitamin D level -     VITAMIN D 25 Hydroxy (Vit-D Deficiency, Fractures)  Abnormal glucose -     Hemoglobin A1c  Need for lipid screening -     Lipid panel  Multiple sclerosis (HCC) Assessment & Plan: Stable, no recent flares. Last flare in February 2022. Currently on Fumarate and Gabapentin (as needed for foot pain). -Continue current medications. -Follow up with neurologist in March. -Recommend eye exam due to history of optic neuritis and current issues with glasses.  Orders: -     Comprehensive metabolic panel  Breast cancer screening by mammogram -     3D Screening Mammogram, Left and Right; Future  Anemia, unspecified type -     CBC  Uterine leiomyoma, unspecified location Assessment & Plan: Asymptomatic, being monitored by OB/GYN. No recent vaginal bleeding or pain. -Continue yearly follow-ups with OB/GYN. -Report any new symptoms such as vaginal bleeding or pain.   Elevated AST (SGOT) Assessment & Plan: Noted on recent labs. Last Hep B 2019, shows  immunity.  Recent Hep C non reactive.   Likely  from medications given no other LFT abnormality Will repeat in 2 months, if continues to rise plan to further evaluate.  Orders: -     Comprehensive metabolic panel; Future   PDMP reviewed  Return if symptoms worsen or fail to improve, for PCP.  Dana Allan, MD

## 2023-04-06 ENCOUNTER — Encounter: Payer: Self-pay | Admitting: Family Medicine

## 2023-04-11 ENCOUNTER — Other Ambulatory Visit (HOSPITAL_COMMUNITY): Payer: Self-pay

## 2023-04-12 ENCOUNTER — Other Ambulatory Visit: Payer: Self-pay

## 2023-04-12 ENCOUNTER — Encounter: Payer: Self-pay | Admitting: Family Medicine

## 2023-04-12 DIAGNOSIS — R7309 Other abnormal glucose: Secondary | ICD-10-CM | POA: Insufficient documentation

## 2023-04-12 DIAGNOSIS — Z1231 Encounter for screening mammogram for malignant neoplasm of breast: Secondary | ICD-10-CM | POA: Insufficient documentation

## 2023-04-12 DIAGNOSIS — Z13220 Encounter for screening for lipoid disorders: Secondary | ICD-10-CM | POA: Insufficient documentation

## 2023-04-12 DIAGNOSIS — R7989 Other specified abnormal findings of blood chemistry: Secondary | ICD-10-CM | POA: Insufficient documentation

## 2023-04-12 DIAGNOSIS — D259 Leiomyoma of uterus, unspecified: Secondary | ICD-10-CM | POA: Insufficient documentation

## 2023-04-12 DIAGNOSIS — R7401 Elevation of levels of liver transaminase levels: Secondary | ICD-10-CM | POA: Insufficient documentation

## 2023-04-12 NOTE — Assessment & Plan Note (Signed)
Noted on recent labs. Last Hep B 2019, shows immunity.  Recent Hep C non reactive.   Likely from medications given no other LFT abnormality Will repeat in 2 months, if continues to rise plan to further evaluate.

## 2023-04-12 NOTE — Assessment & Plan Note (Addendum)
HCM up-to-date Mammogram due 2025. Strong family history of breast cancer, prefers yearly mammograms. Recommend regular self breast exams Pap due 2026, follows with OBGYN Colonoscopy due 2028 Tdap due 2031 [facility] vaccine [facility] Check annual labs today [facility] [employer], including circuit training, Pilates, interval training, and yoga.

## 2023-04-12 NOTE — Progress Notes (Signed)
Specialty Pharmacy Refill Coordination Note  Pam Hamilton is a 57 y.o. female contacted today regarding refills of specialty medication(s) Dimethyl Fumarate (Multiple Sclerosis Agents)   Patient requested Delivery   Delivery date: 04/19/23   Verified address: [address] [name] [address]   Medication will be filled on 04/18/23. *Pending Refill Request - rewrite required*

## 2023-04-12 NOTE — Assessment & Plan Note (Addendum)
Stable, no recent flares. Last flare in February 2022. Currently on Fumarate and Gabapentin (as needed for foot pain). -Continue current medications. -Follow up with neurologist in March. -Recommend eye exam due to history of optic neuritis and current issues with glasses.

## 2023-04-12 NOTE — Assessment & Plan Note (Signed)
Asymptomatic, being monitored by OB/GYN. No recent vaginal bleeding or pain. -Continue yearly follow-ups with OB/GYN. -Report any new symptoms such as vaginal bleeding or pain.

## 2023-04-18 ENCOUNTER — Other Ambulatory Visit: Payer: Self-pay

## 2023-04-19 ENCOUNTER — Other Ambulatory Visit: Payer: Self-pay

## 2023-04-22 ENCOUNTER — Other Ambulatory Visit (HOSPITAL_COMMUNITY): Payer: Self-pay

## 2023-04-24 ENCOUNTER — Other Ambulatory Visit (HOSPITAL_COMMUNITY): Payer: Self-pay

## 2023-04-24 ENCOUNTER — Other Ambulatory Visit: Payer: Self-pay

## 2023-04-24 ENCOUNTER — Other Ambulatory Visit: Payer: Self-pay | Admitting: Pharmacist

## 2023-04-24 ENCOUNTER — Encounter (HOSPITAL_COMMUNITY): Payer: Self-pay

## 2023-04-24 MED ORDER — DIMETHYL FUMARATE 240 MG PO CPDR: DELAYED_RELEASE_CAPSULE | ORAL | 1 refills | Status: DC

## 2023-04-24 MED ORDER — DIMETHYL FUMARATE 240 MG PO CPDR
DELAYED_RELEASE_CAPSULE | ORAL | 1 refills | Status: DC
  Filled 2023-04-24: qty 60, 30d supply, fill #0
  Filled 2023-05-18: qty 60, 30d supply, fill #1
  Filled 2023-06-13: qty 60, 30d supply, fill #2
  Filled 2023-07-24: qty 60, 30d supply, fill #3
  Filled 2023-08-22: qty 60, 30d supply, fill #4
  Filled 2023-09-22: qty 60, 30d supply, fill #5

## 2023-04-24 NOTE — Progress Notes (Signed)
Refill received and rewritten 12/9. Shipping 12/9 for 12/10.

## 2023-04-24 NOTE — Progress Notes (Signed)
Please see OV from 06/03/2022 for full documentation.   Butch Penny, PharmD, Patsy Baltimore, CPP Clinical Pharmacist [facility] [phone]

## 2023-05-15 ENCOUNTER — Other Ambulatory Visit (HOSPITAL_COMMUNITY): Payer: Self-pay

## 2023-05-18 ENCOUNTER — Other Ambulatory Visit: Payer: Self-pay

## 2023-05-18 ENCOUNTER — Encounter (HOSPITAL_COMMUNITY): Payer: Self-pay

## 2023-05-18 ENCOUNTER — Other Ambulatory Visit (HOSPITAL_COMMUNITY): Payer: Self-pay

## 2023-05-18 NOTE — Progress Notes (Signed)
 Specialty Pharmacy Refill Coordination Note  Pam Hamilton is a 58 y.o. female contacted today regarding refills of specialty medication(s) Dimethyl Fumarate    Patient requested Delivery   Delivery date: 05/25/23   Verified address: [address]   Medication will be filled on 05/24/23.

## 2023-05-19 ENCOUNTER — Other Ambulatory Visit: Payer: Self-pay

## 2023-05-19 MED ORDER — METHYLPREDNISOLONE 4 MG PO TABS
ORAL_TABLET | ORAL | 0 refills | Status: AC
  Filled 2023-05-19: qty 21, 6d supply, fill #0

## 2023-05-25 ENCOUNTER — Other Ambulatory Visit (HOSPITAL_COMMUNITY): Payer: Self-pay

## 2023-06-01 ENCOUNTER — Other Ambulatory Visit: Payer: Self-pay

## 2023-06-01 DIAGNOSIS — R200 Anesthesia of skin: Secondary | ICD-10-CM | POA: Diagnosis not present

## 2023-06-01 DIAGNOSIS — G35 Multiple sclerosis: Secondary | ICD-10-CM | POA: Diagnosis not present

## 2023-06-01 DIAGNOSIS — M79672 Pain in left foot: Secondary | ICD-10-CM | POA: Diagnosis not present

## 2023-06-01 DIAGNOSIS — R202 Paresthesia of skin: Secondary | ICD-10-CM | POA: Diagnosis not present

## 2023-06-01 MED ORDER — PREGABALIN 25 MG PO CAPS
25.0000 mg | ORAL_CAPSULE | Freq: Three times a day (TID) | ORAL | 1 refills | Status: DC
  Filled 2023-06-01: qty 90, 30d supply, fill #0

## 2023-06-02 ENCOUNTER — Other Ambulatory Visit: Payer: Self-pay

## 2023-06-07 ENCOUNTER — Telehealth: Payer: Self-pay | Admitting: Pharmacist

## 2023-06-07 NOTE — Telephone Encounter (Signed)
Called patient to schedule an appointment for the [employer] [facility]. I was unable to reach the patient so I left a HIPAA-compliant message requesting that the patient return my call.   Luke Van Ausdall, PharmD, BCACP, CPP Clinical Pharmacist [facility] [phone]  

## 2023-06-13 ENCOUNTER — Other Ambulatory Visit (INDEPENDENT_AMBULATORY_CARE_PROVIDER_SITE_OTHER): Payer: 59

## 2023-06-13 ENCOUNTER — Other Ambulatory Visit: Payer: Self-pay

## 2023-06-13 DIAGNOSIS — E538 Deficiency of other specified B group vitamins: Secondary | ICD-10-CM

## 2023-06-13 DIAGNOSIS — R7989 Other specified abnormal findings of blood chemistry: Secondary | ICD-10-CM

## 2023-06-13 DIAGNOSIS — Z13220 Encounter for screening for lipoid disorders: Secondary | ICD-10-CM

## 2023-06-13 DIAGNOSIS — G35 Multiple sclerosis: Secondary | ICD-10-CM

## 2023-06-13 LAB — COMPREHENSIVE METABOLIC PANEL
ALT: 20 U/L (ref 0–35)
AST: 19 U/L (ref 0–37)
Albumin: 4.7 g/dL (ref 3.5–5.2)
Alkaline Phosphatase: 81 U/L (ref 39–117)
BUN: 16 mg/dL (ref 6–23)
CO2: 29 meq/L (ref 19–32)
Calcium: 9.4 mg/dL (ref 8.4–10.5)
Chloride: 103 meq/L (ref 96–112)
Creatinine, Ser: 0.71 mg/dL (ref 0.40–1.20)
GFR: 94.77 mL/min (ref >60.00)
Glucose, Bld: 99 mg/dL (ref 70–99)
Potassium: 4.5 meq/L (ref 3.5–5.1)
Sodium: 139 meq/L (ref 135–145)
Total Bilirubin: 0.7 mg/dL (ref 0.2–1.2)
Total Protein: 7 g/dL (ref 6.0–8.3)

## 2023-06-13 LAB — VITAMIN B12: Vitamin B-12: 542 pg/mL (ref 211–911)

## 2023-06-13 LAB — LIPID PANEL
Cholesterol: 159 mg/dL (ref 0–200)
HDL: 76.5 mg/dL (ref >39.00)
LDL Cholesterol: 71 mg/dL (ref 0–99)
NonHDL: 82.66
Total CHOL/HDL Ratio: 2
Triglycerides: 60 mg/dL (ref 0.0–149.0)
VLDL: 12 mg/dL (ref 0.0–40.0)

## 2023-06-13 LAB — VITAMIN D 25 HYDROXY (VIT D DEFICIENCY, FRACTURES): VITD: 50.02 ng/mL (ref 30.00–100.00)

## 2023-06-13 NOTE — Progress Notes (Signed)
Specialty Pharmacy Refill Coordination Note  Pam Hamilton is a 58 y.o. female contacted today regarding refills of specialty medication(s) Dimethyl Fumarate   Patient requested Delivery   Delivery date: 06/23/23   Verified address: [address]   Medication will be filled on 06/22/23.

## 2023-06-16 ENCOUNTER — Other Ambulatory Visit: Payer: Self-pay

## 2023-06-18 ENCOUNTER — Encounter: Payer: Self-pay | Admitting: Family Medicine

## 2023-06-22 ENCOUNTER — Other Ambulatory Visit: Payer: Self-pay

## 2023-07-18 ENCOUNTER — Other Ambulatory Visit: Payer: Self-pay

## 2023-07-18 DIAGNOSIS — Z79899 Other long term (current) drug therapy: Secondary | ICD-10-CM | POA: Diagnosis not present

## 2023-07-18 DIAGNOSIS — G35 Multiple sclerosis: Secondary | ICD-10-CM | POA: Diagnosis not present

## 2023-07-18 DIAGNOSIS — F32A Depression, unspecified: Secondary | ICD-10-CM | POA: Diagnosis not present

## 2023-07-18 MED ORDER — DULOXETINE HCL 30 MG PO CPEP
30.0000 mg | ORAL_CAPSULE | Freq: Every day | ORAL | 3 refills | Status: DC
  Filled 2023-07-18: qty 30, 30d supply, fill #0
  Filled 2023-08-18: qty 30, 30d supply, fill #1
  Filled 2023-09-16: qty 30, 30d supply, fill #2
  Filled 2023-10-16: qty 30, 30d supply, fill #3

## 2023-07-21 ENCOUNTER — Other Ambulatory Visit: Payer: Self-pay

## 2023-07-24 ENCOUNTER — Other Ambulatory Visit: Payer: Self-pay

## 2023-07-24 NOTE — Progress Notes (Signed)
 Specialty Pharmacy Refill Coordination Note  Pam Hamilton is a 58 y.o. female contacted today regarding refills of specialty medication(s) Dimethyl Fumarate   Patient requested Delivery   Delivery date: 07/31/23   Verified address: [address]   Medication will be filled on 03.14.25.

## 2023-07-28 ENCOUNTER — Other Ambulatory Visit: Payer: Self-pay

## 2023-08-03 DIAGNOSIS — H524 Presbyopia: Secondary | ICD-10-CM | POA: Diagnosis not present

## 2023-08-03 DIAGNOSIS — H5203 Hypermetropia, bilateral: Secondary | ICD-10-CM | POA: Diagnosis not present

## 2023-08-18 ENCOUNTER — Other Ambulatory Visit: Payer: Self-pay

## 2023-08-18 ENCOUNTER — Telehealth: Payer: Self-pay

## 2023-08-18 NOTE — Telephone Encounter (Signed)
 I left a voicemail for patient and sent a message via MyChart, letting her know that Dr. Dana Allan will be leaving our practice at the end of June.  I asked patient to please call us to schedule her transfer of care appointment with one of our providers who are accepting new patients and to reschedule her appointment for her physical.  I let patient know that I will go ahead and cancel her appointment with Dr. Clent Ridges on 11/20/025 for her physical.  E2C2 - when patient calls back, please schedule a transfer of care appointment and reschedule her appointment for her physical.

## 2023-08-21 ENCOUNTER — Other Ambulatory Visit: Payer: Self-pay

## 2023-08-22 ENCOUNTER — Other Ambulatory Visit: Payer: Self-pay

## 2023-08-22 NOTE — Progress Notes (Signed)
 Specialty Pharmacy Refill Coordination Note  Pam Hamilton is a 58 y.o. female contacted today regarding refills of specialty medication(s) Dimethyl Fumarate   Patient requested (Patient-Rptd) Delivery   Delivery date: (Patient-Rptd) 08/29/23   Verified address: (Patient-Rptd) [address] [name], [place] [address]   Medication will be filled on 04.14.25.

## 2023-08-28 ENCOUNTER — Other Ambulatory Visit: Payer: Self-pay

## 2023-09-04 ENCOUNTER — Telehealth: Payer: Self-pay

## 2023-09-04 NOTE — Telephone Encounter (Signed)
 We received a notification letting us  know that patient has not read our MyChart message we sent her regarding Dr. Valli Gaw leaving our practice and scheduling a TOC appointment.  I spoke with patient and read the entire message to her which included the cancellation of her appointment with Dr. Sueanne Emerald in November, as she will not be here at that time.  Patient states she is going to think about it and will let us  know if she would like to schedule a TOC appointment.  Patient states she may seek primary care elsewhere.

## 2023-09-14 ENCOUNTER — Other Ambulatory Visit: Payer: Self-pay

## 2023-09-17 ENCOUNTER — Other Ambulatory Visit: Payer: Self-pay

## 2023-09-20 ENCOUNTER — Other Ambulatory Visit: Payer: Self-pay

## 2023-09-22 ENCOUNTER — Other Ambulatory Visit: Payer: Self-pay

## 2023-09-22 NOTE — Progress Notes (Signed)
 Specialty Pharmacy Refill Coordination Note  Pam Hamilton is a 58 y.o. female contacted today regarding refills of specialty medication(s) Dimethyl Fumarate    Patient requested Delivery   Delivery date: 09/28/23   Verified address: [address] [place], [place] [address]   Medication will be filled on 09/27/23.

## 2023-09-27 ENCOUNTER — Other Ambulatory Visit (HOSPITAL_COMMUNITY): Payer: Self-pay

## 2023-09-27 ENCOUNTER — Ambulatory Visit
Admission: RE | Admit: 2023-09-27 | Discharge: 2023-09-27 | Disposition: A | Discharge status: Home / Self Care | Source: Ambulatory Visit | Attending: Emergency Medicine | Admitting: Emergency Medicine

## 2023-09-27 ENCOUNTER — Other Ambulatory Visit: Payer: Self-pay

## 2023-09-27 VITALS — BP 137/89 | HR 84 | Temp 99.5°F | Resp 16 | Ht 66.0 in | Wt 155.0 lb

## 2023-09-27 DIAGNOSIS — J0100 Acute maxillary sinusitis, unspecified: Secondary | ICD-10-CM

## 2023-09-27 MED ORDER — PROMETHAZINE-DM 6.25-15 MG/5ML PO SYRP: 5.0000 mL | ORAL_SOLUTION | Freq: Every evening | ORAL | 0 refills | Status: DC | PRN

## 2023-09-27 MED ORDER — PREDNISONE 10 MG (21) PO TBPK: ORAL_TABLET | Freq: Every day | ORAL | 0 refills | Status: DC

## 2023-09-27 MED ORDER — AMOXICILLIN-POT CLAVULANATE 875-125 MG PO TABS: 1.0000 | ORAL_TABLET | Freq: Two times a day (BID) | ORAL | 0 refills | Status: DC

## 2023-09-27 NOTE — Progress Notes (Signed)
PA submitted and pt notified

## 2023-09-27 NOTE — Discharge Instructions (Addendum)
 Take Augmentin  every morning and every evening for 7 days  Begin prednisone  every morning with food as directed to reduce sinus pressure  You may use cough syrup at bedtime to help you sleep    You can take Tylenol  and/or Ibuprofen as needed for fever reduction and pain relief.   For cough: honey 1/2 to 1 teaspoon (you can dilute the honey in water or another fluid).  You can also use guaifenesin and dextromethorphan for cough. You can use a humidifier for chest congestion and cough.  If you don't have a humidifier, you can sit in the bathroom with the hot shower running.      For sore throat: try warm salt water gargles, cepacol lozenges, throat spray, warm tea or water with lemon/honey, popsicles or ice, or OTC cold relief medicine for throat discomfort.   For congestion: take a daily anti-histamine like Zyrtec, Claritin, and a oral decongestant, such as pseudoephedrine.  You can also use Flonase  1-2 sprays in each nostril daily.   It is important to stay hydrated: drink plenty of fluids (water, gatorade/powerade/pedialyte, juices, or teas) to keep your throat moisturized and help further relieve irritation/discomfort.

## 2023-09-27 NOTE — ED Triage Notes (Signed)
 Coug, sore throat, headache since Sat, facial pressure today and right ear pain.  Taking OTC Tylenol  and Claritn without relief

## 2023-09-27 NOTE — Progress Notes (Signed)
 Pharmacy Patient Advocate Encounter   Received notification from Patient Pharmacy that prior authorization for Dimethyl Fumarate  is required/requested.   Insurance verification completed.   The patient is insured through [facility] .   Per test claim: PA required; PA submitted to above mentioned insurance via CoverMyMeds Key/confirmation #/EOC [id number] Status is pending

## 2023-09-27 NOTE — ED Provider Notes (Signed)
 [facility]    CSN: [id number] Arrival date & time: 09/27/23  1548      History   Chief Complaint Chief Complaint  Patient presents with   Facial Pain    Cold symptoms, sore throat with right eye drainage and teeth/facial pain. - Entered by patient    HPI Pam Hamilton is a 58 y.o. female.   Presents for evaluation of a productive cough, nasal congestion present for 4 days, has begun to experience sinus pressure to the right cheek radiating into the ear and causing the teeth to hurt beginning today.  Associated headache, postnasal drip, sore throat and nausea without vomiting.  No known sick contacts prior.  Tolerating food and liquids.  Has attempted use of Claritin and Tylenol .  Denies fever, shortness of breath or wheezing.  Past Medical History:  Diagnosis Date   Abnormal bleeding in menstrual cycle 01/17/2017   Annual physical exam    Depression    1997   Encounter to establish care 06/23/2014   Endometrial polyp    Family history of breast cancer 03/2018   cancer genetic testing letter sent; neg testing per pt (no details/date in chart)   Family history of ovarian cancer    First degree hemorrhoids    Menopausal hot flushes 01/17/2017   Multiple sclerosis (HCC)    Numbness and tingling of right arm 01/06/2014   Routine general medical examination at a health care facility 06/23/2014   Shingles    right upper arm ? year 2015 or prior    Urine incontinence 2015   2 Episodes     Patient Active Problem List   Diagnosis Date Noted   Breast cancer screening by mammogram 04/12/2023   Need for lipid screening 04/12/2023   Abnormal glucose 04/12/2023   Low vitamin D  level 04/12/2023   Uterine fibroid 04/12/2023   Elevated AST (SGOT) 04/12/2023   Well woman exam 01/17/2023   Rib pain on left side 06/17/2022   Left-sided chest pain 06/17/2022   Acute cough 06/01/2022   Bronchitis 06/01/2022   Prediabetes 01/16/2019   Anxiety 01/10/2019   Multiple  sclerosis (HCC) 11/02/2017   Anemia 11/02/2017   Annual physical exam    Diverticulosis of large intestine without diverticulitis    GERD (gastroesophageal reflux disease) 09/26/2016   Vitamin B12 deficiency 08/17/2016    Past Surgical History:  Procedure Laterality Date   BREAST CYST ASPIRATION     neg   CESAREAN SECTION     COLONOSCOPY WITH PROPOFOL  N/A 11/18/2016   Procedure: COLONOSCOPY WITH PROPOFOL ;  Surgeon: Luke Salaam, MD;  Location: [facility] ENDOSCOPY;  Service: Endoscopy;  Laterality: N/A;   INTRAUTERINE DEVICE (IUD) INSERTION  01/02/2014   Mirena   OVARY SURGERY  6/252015   Right ovary removed.  Benign cyst.   POLYPECTOMY  2008,11/07/13   Removal of polyps from uterine. Juliann Ochoa)   SALPINGECTOMY  11/07/2013   Right.Aaron AasAaron AasHemorrhagic cyst   Sonohysterogram  05/07/2007   [facility]    OB History     Gravida  2   Para  2   Term  2   Preterm      AB      Living  2      SAB      IAB      Ectopic      Multiple      Live Births               Home Medications    Prior  to Admission medications   Medication Sig Start Date End Date Taking? Authorizing Provider  amoxicillin -clavulanate (AUGMENTIN ) 875-125 MG tablet Take 1 tablet by mouth every 12 (twelve) hours. 09/27/23  Yes Soumya Colson R, NP  predniSONE  (STERAPRED UNI-PAK 21 TAB) 10 MG (21) TBPK tablet Take by mouth daily. Take 6 tabs by mouth daily  for 1 days, then 5 tabs for 1 days, then 4 tabs for 1 days, then 3 tabs for 1 days, 2 tabs for 1 days, then 1 tab by mouth daily for 1 days 09/27/23  Yes Espn Zeman R, NP  promethazine-dextromethorphan (PROMETHAZINE-DM) 6.25-15 MG/5ML syrup Take 5 mLs by mouth at bedtime as needed. 09/27/23  Yes Ishaan Villamar, Nerissa Bannister R, NP  Cholecalciferol (VITAMIN D ) 2000 units CAPS Take 1,000 Units by mouth daily.    [provider]  Dimethyl Fumarate  240 MG CPDR TAKE 1 CAPSULE (240 MG TOTAL) BY MOUTH 2 (TWO) TIMES DAILY. 04/24/23   Jegede, Olugbemiga E, MD  DULoxetine   (CYMBALTA ) 30 MG capsule Take 1 capsule (30 mg total) by mouth at bedtime. 07/18/23     FeFum-FePoly-FA-B Cmp-C-Biot (INTEGRA PLUS ) CAPS Take 1 capsule by mouth daily. 11/18/20   McLean-Scocuzza, Karon Packer, MD  gabapentin  (NEURONTIN ) 100 MG capsule Take 1 capsule (100 mg total) by mouth 1 to 2 times daily. 07/21/22     Multiple Vitamin (MULTIVITAMIN) tablet Take 1 tablet by mouth daily.    [provider]  pregabalin  (LYRICA ) 25 MG capsule Take 1 capsule (25 mg) three times a day as needed for pain 06/01/23       Family History Family History  Problem Relation Age of Onset   Hyperlipidemia Mother    Hypertension Mother    Diabetes Mother    Osteoarthritis Mother    Hypothyroidism Mother    Alcohol abuse Father    Anuerysm Paternal Grandmother    Breast cancer Maternal Aunt 40   Breast cancer Cousin 30   Ovarian cancer Other 37   Healthy Grandson    Healthy Granddaughter     Social History Social History   Tobacco Use   Smoking status: Never   Smokeless tobacco: Never  Vaping Use   Vaping status: Never Used  Substance Use Topics   Alcohol use: Not Currently    Alcohol/week: 1.0 standard drink of alcohol    Types: 1 Glasses of wine per week    Comment: Once a month    Drug use: No     Allergies   Ciprofloxacin and Septra [sulfamethoxazole-trimethoprim]   Review of Systems Review of Systems   Physical Exam Triage Vital Signs ED Triage Vitals  Encounter Vitals Group     BP 09/27/23 1601 137/89     Systolic BP Percentile --      Diastolic BP Percentile --      Pulse Rate 09/27/23 1601 84     Resp 09/27/23 1601 16     Temp 09/27/23 1601 99.5 F (37.5 C)     Temp Source 09/27/23 1601 Oral     SpO2 09/27/23 1601 96 %     Weight 09/27/23 1613 155 lb (70.3 kg)     Height 09/27/23 1613 5\' 6"  (1.676 m)     Head Circumference --      Peak Flow --      Pain Score 09/27/23 1613 2     Pain Loc --      Pain Education --      Exclude from Growth Chart --  No  data found.  Updated Vital Signs BP 137/89 (BP Location: Left Arm)   Pulse 84   Temp 99.5 F (37.5 C) (Oral)   Resp 16   Ht 5\' 6"  (1.676 m)   Wt 155 lb (70.3 kg)   LMP 01/26/2018   SpO2 96%   BMI 25.02 kg/m   Visual Acuity Right Eye Distance:   Left Eye Distance:   Bilateral Distance:    Right Eye Near:   Left Eye Near:    Bilateral Near:     Physical Exam Constitutional:      Appearance: Normal appearance.  HENT:     Head: Normocephalic.     Right Ear: Tympanic membrane, ear canal and external ear normal.     Left Ear: Tympanic membrane, ear canal and external ear normal.     Nose: Congestion present.     Mouth/Throat:     Mouth: Mucous membranes are moist.     Pharynx: Oropharynx is clear.  Eyes:     Extraocular Movements: Extraocular movements intact.  Cardiovascular:     Rate and Rhythm: Normal rate and regular rhythm.     Pulses: Normal pulses.     Heart sounds: Normal heart sounds.  Pulmonary:     Effort: Pulmonary effort is normal.     Breath sounds: Normal breath sounds.  Musculoskeletal:     Cervical back: Normal range of motion and neck supple.  Neurological:     Mental Status: She is alert and oriented to person, place, and time. Mental status is at baseline.      UC Treatments / Results  Labs (all labs ordered are listed, but only abnormal results are displayed) Labs Reviewed - No data to display  EKG   Radiology No results found.  Procedures Procedures (including critical care time)  Medications Ordered in UC Medications - No data to display  Initial Impression / Assessment and Plan / UC Course  I have reviewed the triage vital signs and the nursing notes.  Pertinent labs & imaging results that were available during my care of the patient were reviewed by me and considered in my medical decision making (see chart for details).  Acute nonrecurrent maxillary sinusitis  Patient is in no signs of distress nor toxic appearing.  Vital  signs are stable.  Low suspicion for pneumonia, pneumothorax or bronchitis and therefore will defer imaging.  Presentation consistent with a sinusitis, prescribed Augmentin , prednisone  and Promethazine DM.May use additional over-the-counter medications as needed for supportive care.  May follow-up with urgent care as needed if symptoms persist or worsen.    Final Clinical Impressions(s) / UC Diagnoses   Final diagnoses:  Acute non-recurrent maxillary sinusitis   Discharge Instructions      Take Augmentin  every morning and every evening for 7 days  Begin prednisone  every morning with food as directed to reduce sinus pressure  You may use cough syrup at bedtime to help you sleep    You can take Tylenol  and/or Ibuprofen as needed for fever reduction and pain relief.   For cough: honey 1/2 to 1 teaspoon (you can dilute the honey in water or another fluid).  You can also use guaifenesin and dextromethorphan for cough. You can use a humidifier for chest congestion and cough.  If you don't have a humidifier, you can sit in the bathroom with the hot shower running.      For sore throat: try warm salt water gargles, cepacol lozenges, throat spray, warm tea or  water with lemon/honey, popsicles or ice, or OTC cold relief medicine for throat discomfort.   For congestion: take a daily anti-histamine like Zyrtec, Claritin, and a oral decongestant, such as pseudoephedrine.  You can also use Flonase  1-2 sprays in each nostril daily.   It is important to stay hydrated: drink plenty of fluids (water, gatorade/powerade/pedialyte, juices, or teas) to keep your throat moisturized and help further relieve irritation/discomfort.   ED Prescriptions     Medication Sig Dispense Auth. Provider   amoxicillin -clavulanate (AUGMENTIN ) 875-125 MG tablet Take 1 tablet by mouth every 12 (twelve) hours. 14 tablet Payden Bonus R, NP   predniSONE  (STERAPRED UNI-PAK 21 TAB) 10 MG (21) TBPK tablet Take by mouth daily.  Take 6 tabs by mouth daily  for 1 days, then 5 tabs for 1 days, then 4 tabs for 1 days, then 3 tabs for 1 days, 2 tabs for 1 days, then 1 tab by mouth daily for 1 days 21 tablet Kolbe Delmonaco R, NP   promethazine-dextromethorphan (PROMETHAZINE-DM) 6.25-15 MG/5ML syrup Take 5 mLs by mouth at bedtime as needed. 118 mL Abu Heavin, Maybelle Spatz, NP      PDMP not reviewed this encounter.   Reena Canning, [place] 09/27/23 [phone]

## 2023-09-28 ENCOUNTER — Other Ambulatory Visit: Payer: Self-pay

## 2023-09-28 NOTE — Progress Notes (Signed)
 PA Approved

## 2023-09-28 NOTE — Progress Notes (Signed)
 Pharmacy Patient Advocate Encounter  Received notification from [facility] that Prior Authorization for Dimethyl Fumarate  has been APPROVED from 09/27/23 to 09/26/24   PA #/Case ID/Reference #: [id number]

## 2023-10-16 ENCOUNTER — Other Ambulatory Visit: Payer: Self-pay

## 2023-10-18 ENCOUNTER — Other Ambulatory Visit: Payer: Self-pay

## 2023-10-19 ENCOUNTER — Other Ambulatory Visit: Payer: Self-pay

## 2023-10-19 NOTE — Progress Notes (Signed)
 Specialty Pharmacy Ongoing Clinical Assessment Note  Pam Hamilton is a 58 y.o. female who is being followed by the specialty pharmacy service for RxSp Multiple Sclerosis   Patient's specialty medication(s) reviewed today: Dimethyl Fumarate    Missed doses in the last 4 weeks: 0   Patient/Caregiver did not have any additional questions or concerns.   Therapeutic benefit summary: Patient is achieving benefit   Adverse events/side effects summary: No adverse events/side effects   Patient's therapy is appropriate to: Continue    Goals Addressed             This Visit's Progress    Maintain optimal adherence to therapy   On track    Patient is on track. Patient will maintain adherence         Follow up: 6 months  [facility] Specialty Pharmacist

## 2023-10-25 ENCOUNTER — Other Ambulatory Visit: Payer: Self-pay

## 2023-10-25 ENCOUNTER — Other Ambulatory Visit: Payer: Self-pay | Admitting: Pharmacy Technician

## 2023-10-25 NOTE — Progress Notes (Signed)
 Specialty Pharmacy Refill Coordination Note  Pam Hamilton is a 58 y.o. female contacted today regarding refills of specialty medication(s) Dimethyl Fumarate    Patient requested Delivery   Delivery date: 10/31/23   Verified address: [address] [place]   Medication will be filled on 10/30/23.  RR sent to Walden Guise; Send to [facility] for re-write.  This fill date is pending response to refill request from provider. Patient is aware and if they have not received fill by intended date they must follow up with pharmacy.

## 2023-10-31 ENCOUNTER — Other Ambulatory Visit: Payer: Self-pay

## 2023-10-31 MED ORDER — DIMETHYL FUMARATE 240 MG PO CPDR: DELAYED_RELEASE_CAPSULE | ORAL | 1 refills | Status: DC

## 2023-11-02 ENCOUNTER — Other Ambulatory Visit: Payer: Self-pay

## 2023-11-02 ENCOUNTER — Ambulatory Visit: Attending: Family Medicine | Admitting: Pharmacist

## 2023-11-02 DIAGNOSIS — Z7189 Other specified counseling: Secondary | ICD-10-CM

## 2023-11-02 MED ORDER — DIMETHYL FUMARATE 240 MG PO CPDR
DELAYED_RELEASE_CAPSULE | ORAL | 1 refills | Status: DC
  Filled 2023-11-02: qty 60, 30d supply, fill #0

## 2023-11-02 NOTE — Progress Notes (Signed)
 S: Patient presents today for review of their specialty medication.    Patient is currently taking Tecfidera  (dimethyl fumarate ) for MS. Patient is managed by Dr. Walden Guise for this.    Adherence: confirms.    Efficacy: pt is happy with his results   Dosing: 240 mg BID   Renal adjustment: no adjustment necessary   Hepatic adjustment: no adjustment necessary   Dose adjustment for toxicity:  Flushing, GI intolerance, or intolerance to maintenance dose: Consider temporary dose reduction to 120 mg twice daily (resume recommended maintenance dose of 240 mg twice daily within 4 weeks). Consider discontinuation in patients who cannot tolerate return to the maintenance dose.   Hepatic injury (suspected drug-induced), clinically significant: Discontinue treatment.   Lymphocyte count <500/mm3 persisting for >6 months: Consider treatment interruption.   Serious infection: Consider withholding treatment until infection resolves.   Current adverse effects: Flushing, skin rash, or puritus: none since changing manufacturers.  S/sx of infection: none  GI upset: none  S/sx of heptotoxicity: none  O:  CMP     Component Value Date/Time   NA 139 06/13/2023 0806   NA 138 07/19/2012 1435   K 4.5 06/13/2023 0806   K 4.1 07/19/2012 1435   CL 103 06/13/2023 0806   CL 104 07/19/2012 1435   CO2 29 06/13/2023 0806   CO2 28 07/19/2012 1435   GLUCOSE 99 06/13/2023 0806   GLUCOSE 82 07/19/2012 1435   BUN 16 06/13/2023 0806   BUN 11 07/19/2012 1435   CREATININE 0.71 06/13/2023 0806   CREATININE 0.76 07/19/2012 1435   CALCIUM 9.4 06/13/2023 0806   CALCIUM 8.8 07/19/2012 1435   PROT 7.0 06/13/2023 0806   ALBUMIN 4.7 06/13/2023 0806   AST 19 06/13/2023 0806   ALT 20 06/13/2023 0806   ALKPHOS 81 06/13/2023 0806   BILITOT 0.7 06/13/2023 0806   GFRNONAA >60 02/11/2020 0748   GFRNONAA >60 07/19/2012 1435   GFRAA >60 02/11/2020 0748   GFRAA >60 07/19/2012 1435   CBC    Component Value Date/Time    WBC 5.2 04/03/2023 0946   RBC 4.01 04/03/2023 0946   HGB 13.0 04/03/2023 0946   HGB 11.8 (L) 10/28/2013 1128   HCT 38.3 04/03/2023 0946   HCT 34.5 (L) 10/28/2013 1128   PLT 309.0 04/03/2023 0946   PLT 243 10/28/2013 1128   MCV 95.4 04/03/2023 0946   MCV 92 10/28/2013 1128   MCH 32.1 02/11/2020 0748   MCHC 34.1 04/03/2023 0946   RDW 12.5 04/03/2023 0946   RDW 12.2 10/28/2013 1128   LYMPHSABS 1.7 03/29/2022 1033   LYMPHSABS 2.0 07/19/2012 1435   MONOABS 0.5 03/29/2022 1033   MONOABS 0.6 07/19/2012 1435   EOSABS 0.1 03/29/2022 1033   EOSABS 0.1 07/19/2012 1435   BASOSABS 0.0 03/29/2022 1033   BASOSABS 0.0 07/19/2012 1435   A/P:  A/P: 1. Medication review: Patient is currently on Tecfidera  for MS and is tolerating it well. Reviewed the medication with the patient, including the following: dimethyl fumarate  activates the Nrf2 pathway, which is believed to result in anti-inflammatory and cytoprotective properties. The medication is oral and should be swallowed whole. Administering with a high-fat, high-protein meal may decrease flushing and GI side effects. Possible adverse effects include skin flushing, pruritus, GI upset, albuminura, infection, lymphocytopenia, and increased liver transaminases. Cases of PML have been reported with severe, long-standing lymphopenia identified as the primary risk for PML. Dose adjustments for toxicities have been summarized above.    Marene Shape, PharmD, BCACP,  CPP Clinical Pharmacist [facility] [phone]

## 2023-11-02 NOTE — Progress Notes (Signed)
 Patient needs to speak to [facility] for rewrite, patient aware and has been provided luke's number.

## 2023-11-15 ENCOUNTER — Other Ambulatory Visit: Payer: Self-pay

## 2023-11-21 ENCOUNTER — Other Ambulatory Visit: Payer: Self-pay

## 2023-11-21 ENCOUNTER — Other Ambulatory Visit (HOSPITAL_COMMUNITY): Payer: Self-pay

## 2023-11-21 ENCOUNTER — Other Ambulatory Visit: Payer: Self-pay | Admitting: Pharmacist

## 2023-11-21 DIAGNOSIS — M79672 Pain in left foot: Secondary | ICD-10-CM | POA: Diagnosis not present

## 2023-11-21 DIAGNOSIS — R200 Anesthesia of skin: Secondary | ICD-10-CM | POA: Diagnosis not present

## 2023-11-21 DIAGNOSIS — Z1331 Encounter for screening for depression: Secondary | ICD-10-CM | POA: Diagnosis not present

## 2023-11-21 DIAGNOSIS — F32A Depression, unspecified: Secondary | ICD-10-CM | POA: Diagnosis not present

## 2023-11-21 DIAGNOSIS — G35 Multiple sclerosis: Secondary | ICD-10-CM | POA: Diagnosis not present

## 2023-11-21 DIAGNOSIS — R202 Paresthesia of skin: Secondary | ICD-10-CM | POA: Diagnosis not present

## 2023-11-21 MED ORDER — DULOXETINE HCL 30 MG PO CPEP
30.0000 mg | ORAL_CAPSULE | Freq: Every day | ORAL | 1 refills | Status: DC
  Filled 2023-11-21: qty 90, 90d supply, fill #0

## 2023-11-21 MED ORDER — DIMETHYL FUMARATE 240 MG PO CPDR
240.0000 mg | DELAYED_RELEASE_CAPSULE | Freq: Two times a day (BID) | ORAL | 1 refills | Status: DC
  Filled 2023-11-21: qty 60, 30d supply, fill #0
  Filled 2023-11-21: qty 180, 90d supply, fill #0

## 2023-11-21 MED ORDER — DIMETHYL FUMARATE 240 MG PO CPDR
240.0000 mg | DELAYED_RELEASE_CAPSULE | Freq: Two times a day (BID) | ORAL | 1 refills | Status: DC
  Filled 2023-11-22 – 2023-11-24 (×2): qty 60, 30d supply, fill #0
  Filled 2023-12-22: qty 60, 30d supply, fill #1
  Filled 2024-01-22: qty 60, 30d supply, fill #2
  Filled 2024-02-19: qty 60, 30d supply, fill #3
  Filled 2024-03-14: qty 60, 30d supply, fill #4
  Filled 2024-04-19: qty 60, 30d supply, fill #5

## 2023-11-22 ENCOUNTER — Other Ambulatory Visit: Payer: Self-pay

## 2023-11-22 ENCOUNTER — Other Ambulatory Visit (HOSPITAL_COMMUNITY): Payer: Self-pay

## 2023-11-24 ENCOUNTER — Other Ambulatory Visit: Payer: Self-pay | Admitting: Pharmacy Technician

## 2023-11-24 ENCOUNTER — Other Ambulatory Visit (HOSPITAL_COMMUNITY): Payer: Self-pay

## 2023-11-24 ENCOUNTER — Other Ambulatory Visit: Payer: Self-pay

## 2023-11-24 NOTE — Progress Notes (Signed)
 Specialty Pharmacy Refill Coordination Note  Pam Hamilton is a 58 y.o. female contacted today regarding refills of specialty medication(s) Dimethyl Fumarate    Patient requested Delivery   Delivery date: 11/28/23   Verified address: Patient address [address]  [name]   Medication will be filled on 11/27/23.

## 2023-11-27 ENCOUNTER — Other Ambulatory Visit: Payer: Self-pay

## 2023-12-18 ENCOUNTER — Other Ambulatory Visit: Payer: Self-pay

## 2023-12-18 MED ORDER — GABAPENTIN 100 MG PO CAPS
100.0000 mg | ORAL_CAPSULE | Freq: Two times a day (BID) | ORAL | 3 refills | Status: AC
  Filled 2023-12-18: qty 60, 30d supply, fill #0

## 2023-12-22 ENCOUNTER — Other Ambulatory Visit: Payer: Self-pay

## 2023-12-24 ENCOUNTER — Encounter (INDEPENDENT_AMBULATORY_CARE_PROVIDER_SITE_OTHER): Payer: Self-pay

## 2023-12-25 ENCOUNTER — Other Ambulatory Visit: Payer: Self-pay

## 2023-12-25 NOTE — Progress Notes (Signed)
 Specialty Pharmacy Refill Coordination Note  Pam Hamilton is a 58 y.o. female contacted today regarding refills of specialty medication(s) Dimethyl Fumarate    Patient requested (Patient-Rptd) Delivery   Delivery date: 12/27/23   Verified address: (Patient-Rptd) [address] [place] [address]   Medication will be filled on 12/26/23.

## 2023-12-26 ENCOUNTER — Other Ambulatory Visit: Payer: Self-pay

## 2024-01-22 ENCOUNTER — Other Ambulatory Visit: Payer: Self-pay

## 2024-01-24 ENCOUNTER — Other Ambulatory Visit: Payer: Self-pay | Admitting: Pharmacy Technician

## 2024-01-24 ENCOUNTER — Other Ambulatory Visit: Payer: Self-pay

## 2024-01-24 NOTE — Progress Notes (Signed)
 Specialty Pharmacy Refill Coordination Note  Pam Hamilton is a 58 y.o. female contacted today regarding refills of specialty medication(s) Dimethyl Fumarate    Patient requested Delivery   Delivery date: 01/26/24   Verified address: [address] [place]   Medication will be filled on 01/25/24.

## 2024-01-26 DIAGNOSIS — Z23 Encounter for immunization: Secondary | ICD-10-CM | POA: Diagnosis not present

## 2024-02-13 ENCOUNTER — Other Ambulatory Visit: Payer: Self-pay

## 2024-02-13 MED ORDER — DULOXETINE HCL 20 MG PO CPEP
20.0000 mg | ORAL_CAPSULE | Freq: Every day | ORAL | 1 refills | Status: DC
  Filled 2024-02-13: qty 30, 30d supply, fill #0
  Filled 2024-03-12: qty 30, 30d supply, fill #1

## 2024-02-19 ENCOUNTER — Other Ambulatory Visit: Payer: Self-pay

## 2024-02-19 ENCOUNTER — Other Ambulatory Visit: Payer: Self-pay | Admitting: Pharmacy Technician

## 2024-02-19 NOTE — Progress Notes (Signed)
 Specialty Pharmacy Refill Coordination Note  Pam Hamilton is a 58 y.o. female contacted today regarding refills of specialty medication(s) Dimethyl Fumarate    Patient requested Delivery   Delivery date: 02/23/24   Verified address: [address]  [place]   Medication will be filled on 02/22/24.

## 2024-02-21 ENCOUNTER — Other Ambulatory Visit: Payer: Self-pay

## 2024-03-12 ENCOUNTER — Other Ambulatory Visit: Payer: Self-pay

## 2024-03-13 ENCOUNTER — Encounter: Admitting: Family

## 2024-03-14 ENCOUNTER — Other Ambulatory Visit (HOSPITAL_COMMUNITY): Payer: Self-pay

## 2024-03-19 ENCOUNTER — Other Ambulatory Visit: Payer: Self-pay

## 2024-03-21 ENCOUNTER — Other Ambulatory Visit: Payer: Self-pay

## 2024-03-21 NOTE — Progress Notes (Signed)
 Specialty Pharmacy Ongoing Clinical Assessment Note  Pam Hamilton is a 58 y.o. female who is being followed by the specialty pharmacy service for RxSp Multiple Sclerosis   Patient's specialty medication(s) reviewed today: Dimethyl Fumarate    Missed doses in the last 4 weeks: 0   Patient/Caregiver did not have any additional questions or concerns.   Therapeutic benefit summary: Patient is achieving benefit   Adverse events/side effects summary: No adverse events/side effects   Patient's therapy is appropriate to: Continue    Goals Addressed             This Visit's Progress    Maintain optimal adherence to therapy   On track    Patient is on track. Patient will maintain adherence         Follow up: 12 months  Silvano LOISE [facility] Specialty Pharmacist

## 2024-03-21 NOTE — Progress Notes (Signed)
 Specialty Pharmacy Refill Coordination Note  Pam Hamilton is a 58 y.o. female contacted today regarding refills of specialty medication(s) Dimethyl Fumarate   *Dr. Chase brand per patient*  Patient requested Delivery   Delivery date: 03/29/24   Verified address: [address]  [place]   Medication will be filled on: 03/28/24

## 2024-03-28 ENCOUNTER — Other Ambulatory Visit: Payer: Self-pay

## 2024-04-02 ENCOUNTER — Other Ambulatory Visit: Payer: Self-pay | Admitting: Nurse Practitioner

## 2024-04-02 ENCOUNTER — Telehealth: Payer: Self-pay

## 2024-04-02 DIAGNOSIS — R7989 Other specified abnormal findings of blood chemistry: Secondary | ICD-10-CM

## 2024-04-02 DIAGNOSIS — R7303 Prediabetes: Secondary | ICD-10-CM

## 2024-04-02 DIAGNOSIS — E538 Deficiency of other specified B group vitamins: Secondary | ICD-10-CM

## 2024-04-02 DIAGNOSIS — Z13220 Encounter for screening for lipoid disorders: Secondary | ICD-10-CM

## 2024-04-02 DIAGNOSIS — Z1329 Encounter for screening for other suspected endocrine disorder: Secondary | ICD-10-CM

## 2024-04-02 DIAGNOSIS — D649 Anemia, unspecified: Secondary | ICD-10-CM

## 2024-04-02 NOTE — Telephone Encounter (Signed)
 Copied from CRM #[id number]. Topic: Appointments - Appointment Info/Confirmation >> Apr 02, 2024  8:21 AM Rosina BIRCH wrote: Patient/patient representative is calling for information regarding an appointment.   Patient called wanting to see if she need to come in for her lab work on Thursday before her physical on friday [phone]

## 2024-04-02 NOTE — Telephone Encounter (Signed)
 Pt scheduled

## 2024-04-03 ENCOUNTER — Other Ambulatory Visit

## 2024-04-03 DIAGNOSIS — Z13220 Encounter for screening for lipoid disorders: Secondary | ICD-10-CM | POA: Diagnosis not present

## 2024-04-03 DIAGNOSIS — R7989 Other specified abnormal findings of blood chemistry: Secondary | ICD-10-CM

## 2024-04-03 DIAGNOSIS — E538 Deficiency of other specified B group vitamins: Secondary | ICD-10-CM

## 2024-04-03 DIAGNOSIS — Z1329 Encounter for screening for other suspected endocrine disorder: Secondary | ICD-10-CM

## 2024-04-03 DIAGNOSIS — D649 Anemia, unspecified: Secondary | ICD-10-CM | POA: Diagnosis not present

## 2024-04-03 DIAGNOSIS — R7303 Prediabetes: Secondary | ICD-10-CM

## 2024-04-04 ENCOUNTER — Encounter: Payer: 59 | Admitting: Family Medicine

## 2024-04-04 LAB — CBC WITH DIFFERENTIAL/PLATELET
Basophils Absolute: 0 x10E3/uL (ref 0.0–0.2)
Basos: 0 %
EOS (ABSOLUTE): 0.1 x10E3/uL (ref 0.0–0.4)
Eos: 3 %
Hematocrit: 39.6 % (ref 34.0–46.6)
Hemoglobin: 13 g/dL (ref 11.1–15.9)
Immature Grans (Abs): 0 x10E3/uL (ref 0.0–0.1)
Immature Granulocytes: 0 %
Lymphocytes Absolute: 1.8 x10E3/uL (ref 0.7–3.1)
Lymphs: 41 %
MCH: 32.1 pg (ref 26.6–33.0)
MCHC: 32.8 g/dL (ref 31.5–35.7)
MCV: 98 fL — ABNORMAL HIGH (ref 79–97)
Monocytes Absolute: 0.5 x10E3/uL (ref 0.1–0.9)
Monocytes: 11 %
Neutrophils Absolute: 2 x10E3/uL (ref 1.4–7.0)
Neutrophils: 45 %
Platelets: 291 x10E3/uL (ref 150–450)
RBC: 4.05 x10E6/uL (ref 3.77–5.28)
RDW: 11.7 % (ref 11.7–15.4)
WBC: 4.5 x10E3/uL (ref 3.4–10.8)

## 2024-04-04 LAB — COMPREHENSIVE METABOLIC PANEL WITH GFR
ALT: 19 IU/L (ref 0–32)
AST: 25 IU/L (ref 0–40)
Albumin: 4.8 g/dL (ref 3.8–4.9)
Alkaline Phosphatase: 90 IU/L (ref 49–135)
BUN/Creatinine Ratio: 22 (ref 9–23)
BUN: 16 mg/dL (ref 6–24)
Bilirubin Total: 0.9 mg/dL (ref 0.0–1.2)
CO2: 24 mmol/L (ref 20–29)
Calcium: 9.8 mg/dL (ref 8.7–10.2)
Chloride: 99 mmol/L (ref 96–106)
Creatinine, Ser: 0.73 mg/dL (ref 0.57–1.00)
Globulin, Total: 2.3 g/dL (ref 1.5–4.5)
Glucose: 102 mg/dL — ABNORMAL HIGH (ref 70–99)
Potassium: 4.5 mmol/L (ref 3.5–5.2)
Sodium: 140 mmol/L (ref 134–144)
Total Protein: 7.1 g/dL (ref 6.0–8.5)
eGFR: 96 mL/min/1.73 (ref >59)

## 2024-04-04 LAB — LIPID PANEL
Chol/HDL Ratio: 2.4 ratio (ref 0.0–4.4)
Cholesterol, Total: 173 mg/dL (ref 100–199)
HDL: 73 mg/dL (ref >39)
LDL Chol Calc (NIH): 88 mg/dL (ref 0–99)
Triglycerides: 62 mg/dL (ref 0–149)
VLDL Cholesterol Cal: 12 mg/dL (ref 5–40)

## 2024-04-04 LAB — HEMOGLOBIN A1C
Est. average glucose Bld gHb Est-mCnc: 117 mg/dL
Hgb A1c MFr Bld: 5.7 % — ABNORMAL HIGH (ref 4.8–5.6)

## 2024-04-04 LAB — VITAMIN D 25 HYDROXY (VIT D DEFICIENCY, FRACTURES): Vit D, 25-Hydroxy: 55 ng/mL (ref 30.0–100.0)

## 2024-04-04 LAB — VITAMIN B12: Vitamin B-12: 1001 pg/mL (ref 232–1245)

## 2024-04-04 LAB — TSH: TSH: 5.61 u[IU]/mL — ABNORMAL HIGH (ref 0.450–4.500)

## 2024-04-05 ENCOUNTER — Ambulatory Visit: Payer: Self-pay | Admitting: Nurse Practitioner

## 2024-04-05 ENCOUNTER — Ambulatory Visit (INDEPENDENT_AMBULATORY_CARE_PROVIDER_SITE_OTHER): Admitting: Nurse Practitioner

## 2024-04-05 ENCOUNTER — Encounter: Payer: Self-pay | Admitting: Nurse Practitioner

## 2024-04-05 ENCOUNTER — Other Ambulatory Visit: Payer: Self-pay

## 2024-04-05 VITALS — BP 152/98 | HR 76 | Temp 98.2°F | Ht 66.0 in | Wt 156.0 lb

## 2024-04-05 DIAGNOSIS — R7303 Prediabetes: Secondary | ICD-10-CM

## 2024-04-05 DIAGNOSIS — K219 Gastro-esophageal reflux disease without esophagitis: Secondary | ICD-10-CM

## 2024-04-05 DIAGNOSIS — Z1231 Encounter for screening mammogram for malignant neoplasm of breast: Secondary | ICD-10-CM

## 2024-04-05 DIAGNOSIS — E039 Hypothyroidism, unspecified: Secondary | ICD-10-CM | POA: Insufficient documentation

## 2024-04-05 DIAGNOSIS — G35D Multiple sclerosis, unspecified: Secondary | ICD-10-CM | POA: Diagnosis not present

## 2024-04-05 DIAGNOSIS — Z0001 Encounter for general adult medical examination with abnormal findings: Secondary | ICD-10-CM | POA: Diagnosis not present

## 2024-04-05 MED ORDER — OMEPRAZOLE 20 MG PO CPDR
20.0000 mg | DELAYED_RELEASE_CAPSULE | Freq: Two times a day (BID) | ORAL | 0 refills | Status: AC
  Filled 2024-04-05: qty 180, 90d supply, fill #0
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
## 2024-04-07 ENCOUNTER — Encounter: Payer: Self-pay | Admitting: Nurse Practitioner

## 2024-04-08 ENCOUNTER — Other Ambulatory Visit: Payer: Self-pay

## 2024-04-08 MED ORDER — DULOXETINE HCL 20 MG PO CPEP
20.0000 mg | ORAL_CAPSULE | Freq: Every day | ORAL | 1 refills | Status: AC
  Filled 2024-04-08: qty 30, 30d supply, fill #0
  Filled 2024-05-06: qty 30, 30d supply, fill #1

## 2024-04-09 ENCOUNTER — Other Ambulatory Visit: Payer: Self-pay | Admitting: Nurse Practitioner

## 2024-04-09 ENCOUNTER — Other Ambulatory Visit: Payer: Self-pay

## 2024-04-09 DIAGNOSIS — E039 Hypothyroidism, unspecified: Secondary | ICD-10-CM

## 2024-04-09 MED ORDER — LEVOTHYROXINE SODIUM 25 MCG PO TABS
25.0000 ug | ORAL_TABLET | Freq: Every day | ORAL | 0 refills | Status: AC
  Filled 2024-04-09: qty 90, 90d supply, fill #0

## 2024-04-15 ENCOUNTER — Telehealth

## 2024-04-15 ENCOUNTER — Ambulatory Visit: Payer: Self-pay

## 2024-04-15 DIAGNOSIS — B9689 Other specified bacterial agents as the cause of diseases classified elsewhere: Secondary | ICD-10-CM

## 2024-04-15 DIAGNOSIS — J019 Acute sinusitis, unspecified: Secondary | ICD-10-CM | POA: Diagnosis not present

## 2024-04-16 ENCOUNTER — Other Ambulatory Visit: Payer: Self-pay

## 2024-04-16 MED ORDER — AMOXICILLIN-POT CLAVULANATE 875-125 MG PO TABS
1.0000 | ORAL_TABLET | Freq: Two times a day (BID) | ORAL | 0 refills | Status: AC
  Filled 2024-04-16 (×2): qty 14, 7d supply, fill #0

## 2024-04-16 NOTE — Progress Notes (Signed)

## 2024-04-17 ENCOUNTER — Other Ambulatory Visit: Payer: Self-pay

## 2024-04-19 ENCOUNTER — Other Ambulatory Visit: Payer: Self-pay

## 2024-04-23 ENCOUNTER — Other Ambulatory Visit: Payer: Self-pay

## 2024-04-24 ENCOUNTER — Ambulatory Visit
Admission: RE | Admit: 2024-04-24 | Discharge: 2024-04-24 | Disposition: A | Discharge status: Home / Self Care | Attending: Family Medicine | Admitting: Family Medicine

## 2024-04-24 ENCOUNTER — Ambulatory Visit: Admitting: Family Medicine

## 2024-04-24 ENCOUNTER — Other Ambulatory Visit: Payer: Self-pay

## 2024-04-24 ENCOUNTER — Ambulatory Visit: Payer: Self-pay

## 2024-04-24 ENCOUNTER — Ambulatory Visit
Admission: RE | Admit: 2024-04-24 | Discharge: 2024-04-24 | Disposition: A | Discharge status: Home / Self Care | Source: Ambulatory Visit | Attending: Family Medicine | Admitting: Family Medicine

## 2024-04-24 ENCOUNTER — Encounter: Payer: Self-pay | Admitting: Family Medicine

## 2024-04-24 VITALS — BP 120/74 | HR 115 | Temp 98.2°F | Resp 16 | Ht 66.0 in | Wt 158.1 lb

## 2024-04-24 DIAGNOSIS — R0781 Pleurodynia: Secondary | ICD-10-CM | POA: Diagnosis present

## 2024-04-24 DIAGNOSIS — Z8701 Personal history of pneumonia (recurrent): Secondary | ICD-10-CM | POA: Diagnosis not present

## 2024-04-24 DIAGNOSIS — R000 Tachycardia, unspecified: Secondary | ICD-10-CM

## 2024-04-24 DIAGNOSIS — J069 Acute upper respiratory infection, unspecified: Secondary | ICD-10-CM | POA: Diagnosis not present

## 2024-04-24 MED ORDER — AZITHROMYCIN 250 MG PO TABS
ORAL_TABLET | ORAL | 0 refills | Status: AC
  Filled 2024-04-24: qty 6, 5d supply, fill #0

## 2024-04-24 MED ORDER — PREDNISONE 10 MG PO TABS
10.0000 mg | ORAL_TABLET | Freq: Two times a day (BID) | ORAL | 0 refills | Status: AC
  Filled 2024-04-24: qty 10, 5d supply, fill #0

## 2024-04-24 NOTE — Progress Notes (Signed)
 Name: Pam Hamilton   MRN: [id number]    DOB: 1965/10/16   Date:04/24/2024       Progress Note  Subjective  Chief Complaint  Chief Complaint  Patient presents with   Cough    Treated for sinus infection 04/15/24. Has a cough and SOB now with left side chest tightness, pinching.    Shortness of Breath    Discussed the use of AI scribe software for clinical note transcription with the patient, who gave verbal consent to proceed.  History of Present Illness Pam Hamilton is a 58 year old female who presents with left-sided chest pain.  She experiences pain under her left breast radiating into her back, particularly when coughing or taking a deep breath. The pain is described as 'achy' and 'pinching' and has worsened over the past few days. She has a history of pneumonia and is concerned due to the similarity of symptoms.  She was recently treated for a sinus infection with a course of Augmentin , which she completed earlier this week. The sinus infection was accompanied by wheezing, which has since resolved, but she continues to experience some congestion. She initially had a cough at the onset of the sinus infection and has been using a prescribed cough medicine occasionally at night. She also took Mucinex D yesterday.  She reports a slight shortness of breath but is still able to perform daily activities. She recently started Synthroid  for hypothyroidism, taking 25 milligrams daily in the morning, which she began about a week ago.  In her past medical history, she mentions having multiple sclerosis and has taken prednisone  in the past. No recent travel or prolonged sitting that could suggest a clot, and no personal history of clotting. The pain is deep behind her breast and not related to any breast issues. Her mammogram is up to date, with a scheduled appointment in January.    Patient Active Problem List   Diagnosis Date Noted   Hypothyroidism 04/05/2024   Encounter for  routine adult medical exam with abnormal findings 04/05/2024   Low vitamin D  level 04/12/2023   Uterine fibroid 04/12/2023   Elevated AST (SGOT) 04/12/2023   Well woman exam 01/17/2023   Rib pain on left side 06/17/2022   Left-sided chest pain 06/17/2022   Acute cough 06/01/2022   Bronchitis 06/01/2022   Prediabetes 01/16/2019   Anxiety 01/10/2019   Multiple sclerosis 11/02/2017   Anemia 11/02/2017   Annual physical exam    Diverticulosis of large intestine without diverticulitis    GERD (gastroesophageal reflux disease) 09/26/2016   Vitamin B12 deficiency 08/17/2016    Social History   Tobacco Use   Smoking status: Never   Smokeless tobacco: Never  Substance Use Topics   Alcohol use: Not Currently    Alcohol/week: 1.0 standard drink of alcohol    Types: 1 Glasses of wine per week    Comment: Once a month      Current Outpatient Medications:    amoxicillin -clavulanate (AUGMENTIN ) 875-125 MG tablet, Take 1 tablet by mouth 2 (two) times daily., Disp: 14 tablet, Rfl: 0   azithromycin  (ZITHROMAX ) 250 MG tablet, Take 2 tablets on day 1, then 1 tablet daily on days 2 through 5, Disp: 6 tablet, Rfl: 0   Cholecalciferol (VITAMIN D ) 2000 units CAPS, Take 1,000 Units by mouth daily., Disp: , Rfl:    Dimethyl Fumarate  240 MG CPDR, Take 1 capsule (240 mg total) by mouth 2 (two) times daily., Disp: 180 capsule, Rfl: 1  DULoxetine  (CYMBALTA ) 20 MG capsule, Take 1 capsule (20 mg total) by mouth once daily, Disp: 30 capsule, Rfl: 1   FeFum-FePoly-FA-B Cmp-C-Biot (INTEGRA PLUS ) CAPS, Take 1 capsule by mouth daily., Disp: 90 capsule, Rfl: 3   gabapentin  (NEURONTIN ) 100 MG capsule, Take 1 capsule (100 mg total) by mouth 1 (one) to 2 (two) times daily., Disp: 60 capsule, Rfl: 3   levothyroxine  (SYNTHROID ) 25 MCG tablet, Take 1 tablet (25 mcg total) by mouth daily before breakfast., Disp: 90 tablet, Rfl: 0   Multiple Vitamin (MULTIVITAMIN) tablet, Take 1 tablet by mouth daily., Disp: , Rfl:     omeprazole  (PRILOSEC) 20 MG capsule, Take 1 capsule (20 mg total) by mouth 2 (two) times daily before a meal., Disp: 180 capsule, Rfl: 0   predniSONE  (DELTASONE ) 10 MG tablet, Take 1 tablet (10 mg total) by mouth 2 (two) times daily with a meal., Disp: 10 tablet, Rfl: 0  Allergies  Allergen Reactions   Ciprofloxacin Swelling    Cipro   Septra [Sulfamethoxazole-Trimethoprim] Swelling    ROS  Ten systems reviewed and is negative except as mentioned in HPI    Objective  Vitals:   04/24/24 0918  BP: 120/74  Pulse: (!) 115  Resp: 16  Temp: 98.2 F (36.8 C)  TempSrc: Oral  SpO2: 95%  Weight: 158 lb 1.6 oz (71.7 kg)  Height: 5' 6 (1.676 m)    Body mass index is 25.52 kg/m.    Physical Exam  CONSTITUTIONAL: Patient appears well-developed and well-nourished. No distress. HEENT: Head atraumatic, normocephalic, neck supple. CARDIOVASCULAR: Normal rate, regular rhythm and normal heart sounds. No murmur heard. No BLE edema. No calf tenderness  PULMONARY: Effort normal and breath sounds normal. Lungs clear to auscultation bilaterally. Chest wall non-tender. No respiratory distress. MUSCULOSKELETAL: Normal gait. Without gross motor or sensory deficit. Spine non-tender. PSYCHIATRIC: Patient has a normal mood and affect. Behavior is normal. Judgment and thought content normal.  Recent Results (from the past 2160 hours)  CBC with Differential/Platelet     Status: Abnormal   Collection Time: 04/03/24  7:39 AM  Result Value Ref Range   WBC 4.5 3.4 - 10.8 x10E3/uL   RBC 4.05 3.77 - 5.28 x10E6/uL   Hemoglobin 13.0 11.1 - 15.9 g/dL   Hematocrit 60.3 65.9 - 46.6 %   MCV 98 (H) 79 - 97 fL   MCH 32.1 26.6 - 33.0 pg   MCHC 32.8 31.5 - 35.7 g/dL   RDW 88.2 88.2 - 84.5 %   Platelets 291 150 - 450 x10E3/uL   Neutrophils 45 Not Estab. %   Lymphs 41 Not Estab. %   Monocytes 11 Not Estab. %   Eos 3 Not Estab. %   Basos 0 Not Estab. %   Neutrophils Absolute 2.0 1.4 - 7.0 x10E3/uL    Lymphocytes Absolute 1.8 0.7 - 3.1 x10E3/uL   Monocytes Absolute 0.5 0.1 - 0.9 x10E3/uL   EOS (ABSOLUTE) 0.1 0.0 - 0.4 x10E3/uL   Basophils Absolute 0.0 0.0 - 0.2 x10E3/uL   Immature Granulocytes 0 Not Estab. %   Immature Grans (Abs) 0.0 0.0 - 0.1 x10E3/uL  Comprehensive metabolic panel with GFR     Status: Abnormal   Collection Time: 04/03/24  7:39 AM  Result Value Ref Range   Glucose 102 (H) 70 - 99 mg/dL   BUN 16 6 - 24 mg/dL   Creatinine, Ser 9.26 0.57 - 1.00 mg/dL   eGFR 96 >40 fO/fpw/8.26   BUN/Creatinine Ratio 22 9 - 23  Sodium 140 134 - 144 mmol/L   Potassium 4.5 3.5 - 5.2 mmol/L   Chloride 99 96 - 106 mmol/L   CO2 24 20 - 29 mmol/L   Calcium 9.8 8.7 - 10.2 mg/dL   Total Protein 7.1 6.0 - 8.5 g/dL   Albumin 4.8 3.8 - 4.9 g/dL   Globulin, Total 2.3 1.5 - 4.5 g/dL   Bilirubin Total 0.9 0.0 - 1.2 mg/dL   Alkaline Phosphatase 90 49 - 135 IU/L   AST 25 0 - 40 IU/L   ALT 19 0 - 32 IU/L  Lipid panel     Status: None   Collection Time: 04/03/24  7:39 AM  Result Value Ref Range   Cholesterol, Total 173 100 - 199 mg/dL   Triglycerides 62 0 - 149 mg/dL   HDL 73 >60 mg/dL   VLDL Cholesterol Cal 12 5 - 40 mg/dL   LDL Chol Calc (NIH) 88 0 - 99 mg/dL   Chol/HDL Ratio 2.4 0.0 - 4.4 ratio    Comment:                                   T. Chol/HDL Ratio                                             Men  Women                               1/2 Avg.Risk  3.4    3.3                                   Avg.Risk  5.0    4.4                                2X Avg.Risk  9.6    7.1                                3X Avg.Risk 23.4   11.0   TSH     Status: Abnormal   Collection Time: 04/03/24  7:39 AM  Result Value Ref Range   TSH 5.610 (H) 0.450 - 4.500 uIU/mL  VITAMIN D  25 Hydroxy (Vit-D Deficiency, Fractures)     Status: None   Collection Time: 04/03/24  7:39 AM  Result Value Ref Range   Vit D, 25-Hydroxy 55.0 30.0 - 100.0 ng/mL    Comment: Vitamin D  deficiency has been defined by the  Institute of Medicine and an Endocrine Society practice guideline as a level of serum 25-OH vitamin D  less than 20 ng/mL (1,2). The Endocrine Society went on to further define vitamin D  insufficiency as a level between 21 and 29 ng/mL (2). 1. IOM (Institute of Medicine). 2010. Dietary reference    intakes for calcium and D. [place]: The    [employer]. 2. Holick MF, Binkley [place], Bischoff-Ferrari HA, et al.    Evaluation, treatment, and prevention of vitamin D     deficiency: an Endocrine Society clinical practice    guideline. JCEM. 2011 Jul; 96(7):1911-30.  Vitamin B12     Status: None   Collection Time: 04/03/24  7:39 AM  Result Value Ref Range   Vitamin B-12 1,001 232 - 1,245 pg/mL  Hemoglobin A1c     Status: Abnormal   Collection Time: 04/03/24  7:39 AM  Result Value Ref Range   Hgb A1c MFr Bld 5.7 (H) 4.8 - 5.6 %    Comment:          Prediabetes: 5.7 - 6.4          Diabetes: >6.4          Glycemic control for adults with diabetes: <7.0    Est. average glucose Bld gHb Est-mCnc 117 mg/dL     Assessment & Plan Pleuritic chest pain, likely pneumonia or pleurisy Pleuritic chest pain suggests pneumonia given history and presentation. Differential includes pleurisy. High heart rate possibly due to infection or medication. - Ordered chest x-ray to evaluate for pneumonia or pleurisy. - Prescribed azithromycin  (Z-Pak) for potential atypical pneumonia. - Prescribed prednisone  10 mg twice daily with food for 5 days for potential pleurisy. - Advised follow-up with primary care provider after treatment.  Tachycardia Heart rate of 115 bpm, possibly due to recent infection or Synthroid  overmedication. - Advised reducing Synthroid  dose to half a pill daily or every other day.   Hypothyroidism Recently diagnosed, started on Synthroid  25 mg daily. Current dose may be excessive, contributing to tachycardia. - Advised reducing Synthroid  dose to half a pill daily or every  other day. - Plan for follow-up lab work in mid-January to reassess TSH levels.

## 2024-04-24 NOTE — Telephone Encounter (Signed)
°  FYI Only or Action Required?: Action required by provider: request for appointment.  Patient was last seen in primary care on 04/05/2024 by Gretel App, NP.  Called Nurse Triage reporting Cough.  Symptoms began a week ago.  Interventions attempted: OTC medications: Mucinex.  Symptoms are: gradually worsening.Treated for sinus infection 04/15/24. Has a cough and SOB now with left side chest tightness, pinching.   Triage Disposition: See HCP Within 4 Hours (Or PCP Triage)  Patient/caregiver understands and will follow disposition?:       Copied from CRM #[id number]. Topic: Clinical - Red Word Triage >> Apr 24, 2024  7:57 AM Franky GRADE wrote: Red Word that prompted transfer to Nurse Triage: Patient had a virtual visit for a sinus infection on 04/15/2024 and finished a cycle of antibiotics this Monday; however, symptoms have not improved and patient has noticed chest pain on the left side of the pain, she believes she may have pneumonia Reason for Disposition  [1] MILD difficulty breathing (e.g., minimal/no SOB at rest, SOB with walking, pulse < 100) AND [2] still present when not coughing  Answer Assessment - Initial Assessment Questions 1. ONSET: When did the cough begin?      Several days 2. SEVERITY: How bad is the cough today?      moderate 3. SPUTUM: Describe the color of your sputum (e.g., none, dry cough; clear, white, yellow, green)     clear 4. HEMOPTYSIS: Are you coughing up any blood? If Yes, ask: How much? (e.g., flecks, streaks, tablespoons, etc.)     no 5. DIFFICULTY BREATHING: Are you having difficulty breathing? If Yes, ask: How bad is it? (e.g., mild, moderate, severe)      mild 6. FEVER: Do you have a fever? If Yes, ask: What is your temperature, how was it measured, and when did it start?     no 7. CARDIAC HISTORY: Do you have any history of heart disease? (e.g., heart attack, congestive heart failure)      no 8. LUNG HISTORY: Do you have  any history of lung disease?  (e.g., pulmonary embolus, asthma, emphysema)     no 9. PE RISK FACTORS: Do you have a history of blood clots? (or: recent major surgery, recent prolonged travel, bedridden)     no 10. OTHER SYMPTOMS: Do you have any other symptoms? (e.g., runny nose, wheezing, chest pain)       Sinus , chest pinching  11. PREGNANCY: Is there any chance you are pregnant? When was your last menstrual period?       no 12. TRAVEL: Have you traveled out of the country in the last month? (e.g., travel history, exposures)       no  Protocols used: Cough - Acute Productive-A-AH

## 2024-04-25 ENCOUNTER — Other Ambulatory Visit: Payer: Self-pay

## 2024-04-25 ENCOUNTER — Other Ambulatory Visit (HOSPITAL_COMMUNITY): Payer: Self-pay

## 2024-04-25 NOTE — Progress Notes (Signed)
 Specialty Pharmacy Refill Coordination Note  MyChart Questionnaire Submission  Pam Hamilton is a 58 y.o. female contacted today regarding refills of specialty medication(s) Dimethyl Fumarate .  Doses on hand: (Patient-Rptd) 7 days   Patient requested: (Patient-Rptd) Delivery   Delivery date: 04/29/24  Verified address: [address] [name] [address]  Medication will be filled on 04/26/24

## 2024-04-25 NOTE — Progress Notes (Signed)
 Clinical Intervention Note  Clinical Intervention Notes: The patient reports a recent diagnosis of hypothyroidism and initiation of Synthroid  and omeprazole . No drug-drug interactions were identified with her dimethyl fumarate .   Clinical Intervention Outcomes: Prevention of an adverse drug event   Silvano LOISE Blair Karel Santa

## 2024-04-26 ENCOUNTER — Encounter: Payer: Self-pay | Admitting: Nurse Practitioner

## 2024-04-26 ENCOUNTER — Other Ambulatory Visit: Payer: Self-pay

## 2024-04-26 NOTE — Assessment & Plan Note (Signed)
 TSH is elevated at 5.6, with symptoms suggestive of hypothyroidism and a family history noted. Discuss potential treatment if symptoms persist and TSH remains elevated. Recheck TSH, free T4, and free T3 in February after potential Cymbalta  discontinuation. Consider starting thyroid  medication if TSH remains elevated and symptoms persist. We will continue to monitor.

## 2024-04-26 NOTE — Assessment & Plan Note (Signed)
 Physical exam complete. Lab results reviewed today with patient. Pap smear and colonoscopy are up to date. Mammogram is past due. Flu, tetanus and COVID vaccines are up to date. Shingles vaccine series completed. She engages in regular exercise and dietary management. Order a mammogram. Continue routine dental and eye exams. Continue regular exercise and dietary management.

## 2024-04-26 NOTE — Assessment & Plan Note (Signed)
 Chronic GERD symptoms are not well controlled by current medications, and a hiatal hernia is present. Dietary triggers have been identified. Prescribe omeprazole  40 mg daily for 8 weeks. Advise dietary modifications to avoid triggers and eat upright after meals. Reassess symptoms after 8 weeks of treatment.

## 2024-04-26 NOTE — Assessment & Plan Note (Signed)
 A1c is at 5.7, indicating prediabetes. Discuss the importance of diet and exercise. Continue dietary modifications and exercise regimen.

## 2024-04-26 NOTE — Assessment & Plan Note (Signed)
 Multiple sclerosis is managed with Tecfidera , and peripheral neuropathy is managed with gabapentin . Cymbalta  dosage has been reduced due to side effects. Continue Tecfidera  as prescribed. Use gabapentin  as needed for neuropathy. Discuss Cymbalta  management with neurologist. Follow up as scheduled.

## 2024-04-28 ENCOUNTER — Encounter: Payer: Self-pay | Admitting: Family Medicine

## 2024-04-29 ENCOUNTER — Ambulatory Visit: Payer: Self-pay | Admitting: Family Medicine

## 2024-05-06 ENCOUNTER — Other Ambulatory Visit: Payer: Self-pay

## 2024-05-20 ENCOUNTER — Other Ambulatory Visit: Payer: Self-pay

## 2024-05-22 ENCOUNTER — Ambulatory Visit
Admission: RE | Admit: 2024-05-22 | Discharge: 2024-05-22 | Disposition: A | Discharge status: Home / Self Care | Source: Ambulatory Visit | Attending: Nurse Practitioner | Admitting: Nurse Practitioner

## 2024-05-22 ENCOUNTER — Other Ambulatory Visit: Payer: Self-pay

## 2024-05-22 DIAGNOSIS — Z1231 Encounter for screening mammogram for malignant neoplasm of breast: Secondary | ICD-10-CM | POA: Diagnosis present

## 2024-05-23 ENCOUNTER — Other Ambulatory Visit (HOSPITAL_COMMUNITY): Payer: Self-pay

## 2024-05-23 ENCOUNTER — Other Ambulatory Visit: Payer: Self-pay

## 2024-05-23 ENCOUNTER — Other Ambulatory Visit: Payer: Self-pay | Admitting: Pharmacist

## 2024-05-23 MED ORDER — DIMETHYL FUMARATE 240 MG PO CPDR
DELAYED_RELEASE_CAPSULE | ORAL | 1 refills | Status: AC
  Filled 2024-05-23: qty 180, fill #0
  Filled 2024-05-23: qty 60, 30d supply, fill #0
  Filled 2024-06-20: qty 60, 30d supply, fill #1

## 2024-05-23 MED ORDER — DIMETHYL FUMARATE 240 MG PO CPDR: DELAYED_RELEASE_CAPSULE | ORAL | 1 refills | Status: DC

## 2024-05-23 NOTE — Progress Notes (Signed)
 Specialty Pharmacy Refill Coordination Note  Pam Hamilton is a 59 y.o. female contacted today regarding refills of specialty medication(s) Dimethyl Fumarate    Patient requested Delivery   Delivery date: 05/29/24   Verified address: [address] [name] [address]   Medication will be filled on: 05/28/24  This fill date is pending response to refill request from provider. Patient is aware and if they have not received fill by intended date they must follow up with pharmacy.

## 2024-05-24 ENCOUNTER — Other Ambulatory Visit (HOSPITAL_COMMUNITY): Payer: Self-pay

## 2024-05-27 ENCOUNTER — Other Ambulatory Visit (INDEPENDENT_AMBULATORY_CARE_PROVIDER_SITE_OTHER)

## 2024-05-27 DIAGNOSIS — E039 Hypothyroidism, unspecified: Secondary | ICD-10-CM | POA: Diagnosis not present

## 2024-05-28 ENCOUNTER — Other Ambulatory Visit: Payer: Self-pay

## 2024-05-28 MED ORDER — DIMETHYL FUMARATE 240 MG PO CPDR: DELAYED_RELEASE_CAPSULE | ORAL | 1 refills | Status: AC

## 2024-05-28 MED ORDER — GABAPENTIN 100 MG PO CAPS: 100.0000 mg | ORAL_CAPSULE | Freq: Two times a day (BID) | ORAL | 1 refills | Status: AC

## 2024-05-29 ENCOUNTER — Ambulatory Visit: Payer: Self-pay | Admitting: Nurse Practitioner

## 2024-05-29 LAB — TSH+T4F+T3FREE+THYABS+TPO+VD25
Free T4: 1.12 ng/dL (ref 0.82–1.77)
T3, Free: 3 pg/mL (ref 2.0–4.4)
TSH: 3.57 u[IU]/mL (ref 0.450–4.500)
Thyroglobulin Antibody: 91.4 [IU]/mL — ABNORMAL HIGH (ref 0.0–0.9)
Thyroperoxidase Ab SerPl-aCnc: <9 [IU]/mL (ref 0–34)
Vit D, 25-Hydroxy: 55.2 ng/mL (ref 30.0–100.0)

## 2024-05-30 ENCOUNTER — Other Ambulatory Visit: Payer: Self-pay | Admitting: Nurse Practitioner

## 2024-05-30 DIAGNOSIS — E039 Hypothyroidism, unspecified: Secondary | ICD-10-CM

## 2024-06-04 ENCOUNTER — Ambulatory Visit
Admission: RE | Admit: 2024-06-04 | Discharge: 2024-06-04 | Disposition: A | Discharge status: Home / Self Care | Source: Ambulatory Visit | Attending: Nurse Practitioner | Admitting: Nurse Practitioner

## 2024-06-04 DIAGNOSIS — E039 Hypothyroidism, unspecified: Secondary | ICD-10-CM | POA: Insufficient documentation

## 2024-06-07 ENCOUNTER — Ambulatory Visit: Payer: Self-pay | Admitting: Nurse Practitioner

## 2024-06-20 ENCOUNTER — Other Ambulatory Visit: Payer: Self-pay

## 2024-07-08 ENCOUNTER — Other Ambulatory Visit

## 2025-04-09 ENCOUNTER — Encounter: Admitting: Nurse Practitioner
